# Patient Record
Sex: Female | Born: 1970 | Race: White | Hispanic: No | Marital: Married | Smoking: Never smoker
Health system: Southern US, Community
[De-identification: ages and names within clinical notes are randomized; demographics above are authoritative.]

## PROBLEM LIST (undated history)

## (undated) DIAGNOSIS — F419 Anxiety disorder, unspecified: Secondary | ICD-10-CM

## (undated) DIAGNOSIS — F329 Major depressive disorder, single episode, unspecified: Secondary | ICD-10-CM

## (undated) DIAGNOSIS — K219 Gastro-esophageal reflux disease without esophagitis: Secondary | ICD-10-CM

## (undated) DIAGNOSIS — E669 Obesity, unspecified: Secondary | ICD-10-CM

## (undated) DIAGNOSIS — M109 Gout, unspecified: Secondary | ICD-10-CM

## (undated) DIAGNOSIS — T7840XA Allergy, unspecified, initial encounter: Secondary | ICD-10-CM

## (undated) DIAGNOSIS — F32A Depression, unspecified: Secondary | ICD-10-CM

## (undated) HISTORY — DX: Anxiety disorder, unspecified: F41.9

## (undated) HISTORY — DX: Allergy, unspecified, initial encounter: T78.40XA

## (undated) HISTORY — DX: Obesity, unspecified: E66.9

## (undated) HISTORY — DX: Depression, unspecified: F32.A

## (undated) HISTORY — PX: ABDOMINAL HYSTERECTOMY: SHX81

## (undated) HISTORY — DX: Gout, unspecified: M10.9

## (undated) HISTORY — PX: TOTAL ABDOMINAL HYSTERECTOMY: SHX209

## (undated) HISTORY — DX: Major depressive disorder, single episode, unspecified: F32.9

## (undated) HISTORY — DX: Gastro-esophageal reflux disease without esophagitis: K21.9

## (undated) HISTORY — PX: CHOLECYSTECTOMY: SHX55

---

## 2007-01-31 ENCOUNTER — Ambulatory Visit: Payer: Self-pay | Admitting: Unknown Physician Specialty

## 2007-02-03 ENCOUNTER — Ambulatory Visit: Payer: Self-pay | Admitting: Gastroenterology

## 2009-05-29 ENCOUNTER — Ambulatory Visit: Payer: Self-pay | Admitting: *Deleted

## 2013-05-09 ENCOUNTER — Ambulatory Visit: Payer: Self-pay

## 2013-05-11 ENCOUNTER — Ambulatory Visit: Payer: Self-pay

## 2014-12-27 ENCOUNTER — Ambulatory Visit: Payer: Self-pay

## 2015-02-11 ENCOUNTER — Ambulatory Visit: Payer: Self-pay | Admitting: Podiatry

## 2015-05-01 ENCOUNTER — Telehealth: Payer: Self-pay | Admitting: Unknown Physician Specialty

## 2015-05-01 MED ORDER — ALPRAZOLAM 0.25 MG PO TABS: 0.2500 mg | ORAL_TABLET | Freq: Two times a day (BID) | ORAL | Status: DC | PRN

## 2015-05-01 NOTE — Telephone Encounter (Signed)
Ordered and printed.  She will have to pick up rx

## 2015-05-01 NOTE — Telephone Encounter (Signed)
Pt called stated she needs a refill on:  Alprazolam  Pt stated she is going out of town and needs a 30 day supply. Pt was last seen in November 2015. Pt is leaving to go out of town tomorrow. Please advise. Please call pt with any concerns or questions @ [phone]. Thanks.

## 2015-05-01 NOTE — Telephone Encounter (Signed)
[facility] [place]

## 2015-05-31 DIAGNOSIS — F32A Depression, unspecified: Secondary | ICD-10-CM

## 2015-05-31 DIAGNOSIS — J309 Allergic rhinitis, unspecified: Secondary | ICD-10-CM

## 2015-05-31 DIAGNOSIS — E669 Obesity, unspecified: Secondary | ICD-10-CM | POA: Insufficient documentation

## 2015-05-31 DIAGNOSIS — F329 Major depressive disorder, single episode, unspecified: Secondary | ICD-10-CM | POA: Insufficient documentation

## 2015-05-31 DIAGNOSIS — F419 Anxiety disorder, unspecified: Secondary | ICD-10-CM

## 2015-06-10 ENCOUNTER — Encounter: Payer: Self-pay | Admitting: Unknown Physician Specialty

## 2015-06-10 ENCOUNTER — Ambulatory Visit (INDEPENDENT_AMBULATORY_CARE_PROVIDER_SITE_OTHER): Payer: BC Managed Care – PPO | Admitting: Unknown Physician Specialty

## 2015-06-10 VITALS — BP 121/80 | HR 80 | Temp 98.1°F | Ht 62.6 in | Wt 163.0 lb

## 2015-06-10 DIAGNOSIS — F419 Anxiety disorder, unspecified: Secondary | ICD-10-CM

## 2015-06-10 MED ORDER — ALPRAZOLAM 0.25 MG PO TABS: 0.2500 mg | ORAL_TABLET | Freq: Two times a day (BID) | ORAL | Status: DC | PRN

## 2015-06-10 NOTE — Assessment & Plan Note (Signed)
Continue present medications. 

## 2015-06-10 NOTE — Progress Notes (Signed)
   BP 121/80 mmHg  Pulse 80  Temp(Src) 98.1 F (36.7 C)  Ht 5' 2.6" (1.59 m)  Wt 163 lb (73.936 kg)  BMI 29.25 kg/m2  SpO2 99%  LMP  (LMP Unknown)   Subjective:    Patient ID: Stacy Phelps, female    DOB: 09/03/1971, 44 y.o.   MRN: [id number]  HPI: Stacy Phelps is a 44 y.o. female  Chief Complaint  Patient presents with  . Medication Refill    pt states she needs her xanax refilled   1.  ANXIETY Takes 1 to 1 1/2 per day.  See last note in which we have tried a lot of alternative medications.  Most recently Busiprione.  She has been on Alprazolam for a long time initially prescribed by the gynecologist.  Has not increased her dose  Mood status:  controlled  H6  Satisfied with current treatment?:  no H2 Medication side effects:  no P1  Medication compliance:  excellent compliance  P1 Symptom severity:  mild  H3  .H: Depression Screen X  Anhedonia:  no  H3 Insomnia:  no  H8 Fatigue:  no  H8 Impaired concentration/indecisiveness:  no  H8 Suicidal ideations:  no  H8 Palpitations:  no  H8 Hyperventilation:  no  H8 Panic attacks:  no  H8 Agoraphobia:  no  H8 Obscessive thoughts:  no  H8 Compulsive behaviors:  no  H8  Relevant past medical, surgical, family and social history reviewed and updated as indicated. Interim medical history since our last visit reviewed. Allergies and medications reviewed and updated.  Review of Systems  Per HPI unless specifically indicated above     Objective:    BP 121/80 mmHg  Pulse 80  Temp(Src) 98.1 F (36.7 C)  Ht 5' 2.6" (1.59 m)  Wt 163 lb (73.936 kg)  BMI 29.25 kg/m2  SpO2 99%  LMP  (LMP Unknown)  Wt Readings from Last 3 Encounters:  06/10/15 163 lb (73.936 kg)  09/28/14 134 lb (60.782 kg)    Physical Exam  Constitutional: She is oriented to person, place, and time. She appears well-developed and well-nourished. No distress.  HENT:  Head: Normocephalic and atraumatic.  Eyes: Conjunctivae and lids are normal. Right eye  exhibits no discharge. Left eye exhibits no discharge. No scleral icterus.  Cardiovascular: Normal rate and regular rhythm.   Pulmonary/Chest: Effort normal. No respiratory distress.  Abdominal: Normal appearance. There is no splenomegaly or hepatomegaly.  Musculoskeletal: Normal range of motion.  Neurological: She is alert and oriented to person, place, and time.  Skin: Skin is intact. No rash noted. No pallor.  Psychiatric: She has a normal mood and affect. Her behavior is normal. Judgment and thought content normal.    No results found for this or any previous visit.    Assessment & Plan:   Problem List Items Addressed This Visit      Other   Acute anxiety - Primary    Continue present medications      Relevant Medications   ALPRAZolam (XANAX) 0.25 MG tablet       Follow up plan: Return for October for PE.

## 2015-07-05 ENCOUNTER — Other Ambulatory Visit: Payer: Self-pay | Admitting: Unknown Physician Specialty

## 2015-08-07 ENCOUNTER — Other Ambulatory Visit: Payer: Self-pay | Admitting: Unknown Physician Specialty

## 2015-09-05 ENCOUNTER — Other Ambulatory Visit: Payer: Self-pay | Admitting: Unknown Physician Specialty

## 2015-09-08 ENCOUNTER — Other Ambulatory Visit: Payer: Self-pay | Admitting: Family Medicine

## 2015-09-09 NOTE — Telephone Encounter (Signed)
Pt called needs refill on Xanax. [facility] [place] Thanks.

## 2015-09-09 NOTE — Telephone Encounter (Signed)
Patient saw Stacy CirriCheryl Wicker last; forwarding Rx request

## 2015-09-11 ENCOUNTER — Other Ambulatory Visit: Payer: Self-pay

## 2015-09-11 MED ORDER — ALPRAZOLAM 0.25 MG PO TABS: 0.2500 mg | ORAL_TABLET | Freq: Two times a day (BID) | ORAL | Status: DC | PRN

## 2015-09-11 NOTE — Telephone Encounter (Signed)
I got a message on another encounter for this patient stating that she called and wanted a refill on xanax. It looks like it was refilled on 09/05/15 so I called the patient to see if she had picked it up. Patient did not answer when I called her early but she just returned by call and she stated that she did not pick it up and states that she didn't know she had too. Rx is not up front for patient so Elnita MaxwellCheryl can you please re-write or re-print the rx for her?

## 2015-09-11 NOTE — Telephone Encounter (Signed)
Called and let patient know that rx was ready for pick up.  

## 2015-09-11 NOTE — Telephone Encounter (Signed)
Called to ask patient about this rx because it was refilled on 09/05/15 so I was calling to see if she had already picked it up. Asked for patient to please return my call.

## 2015-10-04 ENCOUNTER — Encounter: Payer: Self-pay | Admitting: Unknown Physician Specialty

## 2015-10-04 ENCOUNTER — Ambulatory Visit (INDEPENDENT_AMBULATORY_CARE_PROVIDER_SITE_OTHER): Payer: BC Managed Care – PPO | Admitting: Unknown Physician Specialty

## 2015-10-04 VITALS — BP 130/84 | HR 84 | Temp 97.6°F | Ht 63.0 in | Wt 163.0 lb

## 2015-10-04 DIAGNOSIS — M10079 Idiopathic gout, unspecified ankle and foot: Secondary | ICD-10-CM

## 2015-10-04 DIAGNOSIS — Z23 Encounter for immunization: Secondary | ICD-10-CM

## 2015-10-04 DIAGNOSIS — J309 Allergic rhinitis, unspecified: Secondary | ICD-10-CM | POA: Diagnosis not present

## 2015-10-04 DIAGNOSIS — Z0000 Encounter for general adult medical examination without abnormal findings: Secondary | ICD-10-CM | POA: Diagnosis not present

## 2015-10-04 DIAGNOSIS — F419 Anxiety disorder, unspecified: Secondary | ICD-10-CM | POA: Diagnosis not present

## 2015-10-04 DIAGNOSIS — M109 Gout, unspecified: Secondary | ICD-10-CM | POA: Insufficient documentation

## 2015-10-04 MED ORDER — MONTELUKAST SODIUM 10 MG PO TABS: 10.0000 mg | ORAL_TABLET | Freq: Every day | ORAL | Status: DC

## 2015-10-04 MED ORDER — ALPRAZOLAM 0.25 MG PO TABS: 0.2500 mg | ORAL_TABLET | Freq: Two times a day (BID) | ORAL | Status: DC | PRN

## 2015-10-04 NOTE — Progress Notes (Signed)
BP 130/84 mmHg  Pulse 84  Temp(Src) 97.6 F (36.4 C)  Ht 5\' 3"  (1.6 m)  Wt 163 lb (73.936 kg)  BMI 28.88 kg/m2  SpO2 100%  LMP  (LMP Unknown)   Subjective:    Patient ID: Stacy Phelps, female    DOB: 09/12/1971, 44 y.o.   MRN: [id number]  HPI: Stacy Phelps is a 44 y.o. female  Chief Complaint  Patient presents with  . Annual Exam   ANXIETY Takes 1 to 1 1/2 per day.  See last note in which we have tried a lot of alternative medications.  Most recently Busiprione.  She has been on Alprazolam for a long time initially prescribed by the gynecologist.  Has not increased her dose  Gout Had a gout flare this past summer in May/June.  Went to the foot doctor.  No labs were drawn.   Allergies  Does well with both Clariton and Sigulair along with the Nasonex.  She thinks the problem si at the old [employer] she works at.    She did find out she has a family history of diabetes.    Relevant past medical, surgical, family and social history reviewed and updated as indicated. Interim medical history since our last visit reviewed. Allergies and medications reviewed and updated.  Review of Systems  Constitutional: Negative.   HENT: Negative.   Eyes: Negative.   Respiratory: Negative.   Cardiovascular: Negative.   Gastrointestinal: Negative.   Endocrine: Negative.   Genitourinary: Negative.   Musculoskeletal: Negative.   Skin: Negative.   Allergic/Immunologic: Negative.   Neurological: Negative.   Hematological: Negative.   Psychiatric/Behavioral: Negative.     Per HPI unless specifically indicated above     Objective:    BP 130/84 mmHg  Pulse 84  Temp(Src) 97.6 F (36.4 C)  Ht 5\' 3"  (1.6 m)  Wt 163 lb (73.936 kg)  BMI 28.88 kg/m2  SpO2 100%  LMP  (LMP Unknown)  Wt Readings from Last 3 Encounters:  10/04/15 163 lb (73.936 kg)  06/10/15 163 lb (73.936 kg)  09/28/14 134 lb (60.782 kg)    Physical Exam  Constitutional: She is oriented to person, place, and time.  She appears well-developed and well-nourished.  HENT:  Head: Normocephalic and atraumatic.  Eyes: Pupils are equal, round, and reactive to light. Right eye exhibits no discharge. Left eye exhibits no discharge. No scleral icterus.  Neck: Normal range of motion. Neck supple. Carotid bruit is not present. No thyromegaly present.  Cardiovascular: Normal rate, regular rhythm and normal heart sounds.  Exam reveals no gallop and no friction rub.   No murmur heard. Pulmonary/Chest: Effort normal and breath sounds normal. No respiratory distress. She has no wheezes. She has no rales.  Abdominal: Soft. Bowel sounds are normal. There is no tenderness. There is no rebound.  Genitourinary: No breast swelling, tenderness or discharge.  Musculoskeletal: Normal range of motion.  Lymphadenopathy:    She has no cervical adenopathy.  Neurological: She is alert and oriented to person, place, and time.  Skin: Skin is warm, dry and intact. No rash noted.  Psychiatric: She has a normal mood and affect. Her speech is normal and behavior is normal. Judgment and thought content normal. Cognition and memory are normal.    No results found for this or any previous visit.    Assessment & Plan:   Problem List Items Addressed This Visit      Unprioritized   Allergic rhinitis   Chronic anxiety  Relevant Medications   ALPRAZolam (XANAX) 0.25 MG tablet   Gout   Relevant Orders   Uric acid    Other Visit Diagnoses    Immunization due    -  Primary    Relevant Orders    Flu Vaccine QUAD 36+ mos IM (Completed)    Routine general medical examination at a health care facility        Relevant Orders    HIV antibody    Comprehensive metabolic panel    CBC with Differential/Platelet    TSH    Lipid Panel w/o Chol/HDL Ratio        Follow up plan: Return in about 6 months (around 04/02/2016).

## 2015-10-05 LAB — CBC WITH DIFFERENTIAL/PLATELET
BASOS ABS: 0 10*3/uL (ref 0.0–0.2)
Basos: 0 %
EOS (ABSOLUTE): 0.1 10*3/uL (ref 0.0–0.4)
Eos: 1 %
HEMOGLOBIN: 13.5 g/dL (ref 11.1–15.9)
Hematocrit: 38.8 % (ref 34.0–46.6)
IMMATURE GRANS (ABS): 0 10*3/uL (ref 0.0–0.1)
Immature Granulocytes: 0 %
LYMPHS: 16 %
Lymphocytes Absolute: 1.4 10*3/uL (ref 0.7–3.1)
MCH: 30.7 pg (ref 26.6–33.0)
MCHC: 34.8 g/dL (ref 31.5–35.7)
MCV: 88 fL (ref 79–97)
MONOCYTES: 8 %
Monocytes Absolute: 0.7 10*3/uL (ref 0.1–0.9)
NEUTROS PCT: 75 %
Neutrophils Absolute: 6.7 10*3/uL (ref 1.4–7.0)
PLATELETS: 225 10*3/uL (ref 150–379)
RBC: 4.4 x10E6/uL (ref 3.77–5.28)
RDW: 13 % (ref 12.3–15.4)
WBC: 8.8 10*3/uL (ref 3.4–10.8)

## 2015-10-05 LAB — LIPID PANEL W/O CHOL/HDL RATIO
CHOLESTEROL TOTAL: 188 mg/dL (ref 100–199)
HDL: 50 mg/dL (ref >39)
LDL Calculated: 126 mg/dL — ABNORMAL HIGH (ref 0–99)
Triglycerides: 60 mg/dL (ref 0–149)
VLDL CHOLESTEROL CAL: 12 mg/dL (ref 5–40)

## 2015-10-05 LAB — COMPREHENSIVE METABOLIC PANEL
ALK PHOS: 71 IU/L (ref 39–117)
ALT: 18 IU/L (ref 0–32)
AST: 19 IU/L (ref 0–40)
Albumin/Globulin Ratio: 1.8 (ref 1.1–2.5)
Albumin: 4.8 g/dL (ref 3.5–5.5)
BILIRUBIN TOTAL: 0.6 mg/dL (ref 0.0–1.2)
BUN/Creatinine Ratio: 13 (ref 9–23)
BUN: 9 mg/dL (ref 6–24)
CHLORIDE: 98 mmol/L (ref 97–106)
CO2: 24 mmol/L (ref 18–29)
Calcium: 9.5 mg/dL (ref 8.7–10.2)
Creatinine, Ser: 0.72 mg/dL (ref 0.57–1.00)
GFR calc Af Amer: 117 mL/min/{1.73_m2} (ref >59)
GFR calc non Af Amer: 101 mL/min/{1.73_m2} (ref >59)
GLUCOSE: 85 mg/dL (ref 65–99)
Globulin, Total: 2.7 g/dL (ref 1.5–4.5)
Potassium: 3.9 mmol/L (ref 3.5–5.2)
Sodium: 138 mmol/L (ref 136–144)
TOTAL PROTEIN: 7.5 g/dL (ref 6.0–8.5)

## 2015-10-05 LAB — HIV ANTIBODY (ROUTINE TESTING W REFLEX): HIV SCREEN 4TH GENERATION: NONREACTIVE

## 2015-10-05 LAB — TSH: TSH: 1.87 u[IU]/mL (ref 0.450–4.500)

## 2015-10-05 LAB — URIC ACID: Uric Acid: 4.5 mg/dL (ref 2.5–7.1)

## 2015-10-07 ENCOUNTER — Encounter: Payer: Self-pay | Admitting: Unknown Physician Specialty

## 2015-10-07 ENCOUNTER — Telehealth: Payer: Self-pay | Admitting: Unknown Physician Specialty

## 2015-10-07 NOTE — Telephone Encounter (Signed)
Spoke to Acmeheryl about this patient's labs. Elnita MaxwellCheryl told me to let the patient know that the reason she did not do a A1C was because her glucose was within normal range. She also told me to tell the patient that her cholesterol was just a tad bit high but no need for medication. So I called the patient and let her know all of this information.

## 2015-10-07 NOTE — Telephone Encounter (Signed)
Pt would like to know results of labs. She thought she had labs drawn to check her a1c but doesn't see them when she looks at her lab results on my chart.

## 2015-10-08 ENCOUNTER — Other Ambulatory Visit: Payer: Self-pay | Admitting: Unknown Physician Specialty

## 2016-04-23 ENCOUNTER — Other Ambulatory Visit: Payer: Self-pay | Admitting: Unknown Physician Specialty

## 2016-04-29 ENCOUNTER — Ambulatory Visit (INDEPENDENT_AMBULATORY_CARE_PROVIDER_SITE_OTHER): Payer: BC Managed Care – PPO | Admitting: Unknown Physician Specialty

## 2016-04-29 ENCOUNTER — Encounter: Payer: Self-pay | Admitting: Unknown Physician Specialty

## 2016-04-29 VITALS — BP 117/81 | HR 89 | Temp 98.1°F | Ht 62.7 in | Wt 171.8 lb

## 2016-04-29 DIAGNOSIS — F419 Anxiety disorder, unspecified: Secondary | ICD-10-CM

## 2016-04-29 DIAGNOSIS — E669 Obesity, unspecified: Secondary | ICD-10-CM

## 2016-04-29 MED ORDER — ALPRAZOLAM 0.25 MG PO TABS: 0.2500 mg | ORAL_TABLET | Freq: Two times a day (BID) | ORAL | Status: DC | PRN

## 2016-04-29 NOTE — Patient Instructions (Addendum)
Please do call to schedule your mammogram; the number to schedule one at either [facility] or [facility] is [phone]  Look up "intermittent fasting." "the [place]"

## 2016-04-29 NOTE — Progress Notes (Signed)
BP 117/81 mmHg  Pulse 89  Temp(Src) 98.1 F (36.7 C)  Ht 5' 2.7" (1.593 m)  Wt 171 lb 12.8 oz (77.928 kg)  BMI 30.71 kg/m2  SpO2 98%  LMP  (LMP Unknown)   Subjective:    Patient ID: Stacy Phelps, female    DOB: 06/25/1971, 45 y.o.   MRN: [id number]  HPI: Stacy Amourammy R Hibner is a 45 y.o. female  Chief Complaint  Patient presents with  . Anxiety   Anxiety  Pt is here for refills of her alprasolam .25 mg.  She has been taking 1 1/2 pills daily.  She has not increased her dose.  Chart review show that we have tried alternative medications with significant side-effects or poor results.  She has been able to cut back using essential oils.         Frustrated about weight loss.    Relevant past medical, surgical, family and social history reviewed and updated as indicated. Interim medical history since our last visit reviewed. Allergies and medications reviewed and updated.  Review of Systems  Per HPI unless specifically indicated above     Objective:    BP 117/81 mmHg  Pulse 89  Temp(Src) 98.1 F (36.7 C)  Ht 5' 2.7" (1.593 m)  Wt 171 lb 12.8 oz (77.928 kg)  BMI 30.71 kg/m2  SpO2 98%  LMP  (LMP Unknown)  Wt Readings from Last 3 Encounters:  04/29/16 171 lb 12.8 oz (77.928 kg)  10/04/15 163 lb (73.936 kg)  06/10/15 163 lb (73.936 kg)    Physical Exam  Constitutional: She is oriented to person, place, and time. She appears well-developed and well-nourished. No distress.  HENT:  Head: Normocephalic and atraumatic.  Eyes: Conjunctivae and lids are normal. Right eye exhibits no discharge. Left eye exhibits no discharge. No scleral icterus.  Neck: Normal range of motion. Neck supple. No JVD present. Carotid bruit is not present.  Cardiovascular: Normal rate, regular rhythm and normal heart sounds.   Pulmonary/Chest: Effort normal and breath sounds normal.  Abdominal: Normal appearance. There is no splenomegaly or hepatomegaly.  Musculoskeletal: Normal range of motion.   Neurological: She is alert and oriented to person, place, and time.  Skin: Skin is warm, dry and intact. No rash noted. No pallor.  Psychiatric: She has a normal mood and affect. Her behavior is normal. Judgment and thought content normal.    Results for orders placed or performed in visit on 10/04/15  HIV antibody  Result Value Ref Range   HIV Screen 4th Generation wRfx Non Reactive Non Reactive  Comprehensive metabolic panel  Result Value Ref Range   Glucose 85 65 - 99 mg/dL   BUN 9 6 - 24 mg/dL   Creatinine, Ser 4.090.72 0.57 - 1.00 mg/dL   GFR calc non Af Amer 101 >59 mL/min/1.73   GFR calc Af Amer 117 >59 mL/min/1.73   BUN/Creatinine Ratio 13 9 - 23   Sodium 138 136 - 144 mmol/L   Potassium 3.9 3.5 - 5.2 mmol/L   Chloride 98 97 - 106 mmol/L   CO2 24 18 - 29 mmol/L   Calcium 9.5 8.7 - 10.2 mg/dL   Total Protein 7.5 6.0 - 8.5 g/dL   Albumin 4.8 3.5 - 5.5 g/dL   Globulin, Total 2.7 1.5 - 4.5 g/dL   Albumin/Globulin Ratio 1.8 1.1 - 2.5   Bilirubin Total 0.6 0.0 - 1.2 mg/dL   Alkaline Phosphatase 71 39 - 117 IU/L   AST 19 0 -  40 IU/L   ALT 18 0 - 32 IU/L  CBC with Differential/Platelet  Result Value Ref Range   WBC 8.8 3.4 - 10.8 x10E3/uL   RBC 4.40 3.77 - 5.28 x10E6/uL   Hemoglobin 13.5 11.1 - 15.9 g/dL   Hematocrit 16.1 09.6 - 46.6 %   MCV 88 79 - 97 fL   MCH 30.7 26.6 - 33.0 pg   MCHC 34.8 31.5 - 35.7 g/dL   RDW 04.5 40.9 - 81.1 %   Platelets 225 150 - 379 x10E3/uL   Neutrophils 75 %   Lymphs 16 %   Monocytes 8 %   Eos 1 %   Basos 0 %   Neutrophils Absolute 6.7 1.4 - 7.0 x10E3/uL   Lymphocytes Absolute 1.4 0.7 - 3.1 x10E3/uL   Monocytes Absolute 0.7 0.1 - 0.9 x10E3/uL   EOS (ABSOLUTE) 0.1 0.0 - 0.4 x10E3/uL   Basophils Absolute 0.0 0.0 - 0.2 x10E3/uL   Immature Granulocytes 0 %   Immature Grans (Abs) 0.0 0.0 - 0.1 x10E3/uL  TSH  Result Value Ref Range   TSH 1.870 0.450 - 4.500 uIU/mL  Lipid Panel w/o Chol/HDL Ratio  Result Value Ref Range   Cholesterol,  Total 188 100 - 199 mg/dL   Triglycerides 60 0 - 149 mg/dL   HDL 50 >91 mg/dL   VLDL Cholesterol Cal 12 5 - 40 mg/dL   LDL Calculated 478 (H) 0 - 99 mg/dL  Uric acid  Result Value Ref Range   Uric Acid 4.5 2.5 - 7.1 mg/dL      Assessment & Plan:   Problem List Items Addressed This Visit      Unprioritized   Chronic anxiety - Primary    Stable, continue present medications.        Relevant Medications   ALPRAZolam (XANAX) 0.25 MG tablet   Other Relevant Orders   MM DIGITAL SCREENING BILATERAL   Obesity    Struggling with losing weight.  Discussed diet and exercise.            Follow up plan: Return for Physical in October.

## 2016-04-29 NOTE — Assessment & Plan Note (Addendum)
Struggling with losing weight.  Discussed diet and exercise.

## 2016-04-29 NOTE — Assessment & Plan Note (Signed)
Stable, continue present medications.   

## 2016-06-11 ENCOUNTER — Ambulatory Visit
Admission: EM | Admit: 2016-06-11 | Discharge: 2016-06-11 | Disposition: A | Discharge status: Home / Self Care | Payer: BC Managed Care – PPO | Attending: Family Medicine | Admitting: Family Medicine

## 2016-06-11 ENCOUNTER — Encounter: Payer: Self-pay | Admitting: *Deleted

## 2016-06-11 DIAGNOSIS — K625 Hemorrhage of anus and rectum: Secondary | ICD-10-CM

## 2016-06-11 DIAGNOSIS — R102 Pelvic and perineal pain: Secondary | ICD-10-CM | POA: Diagnosis not present

## 2016-06-11 DIAGNOSIS — R109 Unspecified abdominal pain: Secondary | ICD-10-CM

## 2016-06-11 LAB — URINALYSIS COMPLETE WITH MICROSCOPIC (ARMC ONLY)
Bacteria, UA: NONE SEEN
Bilirubin Urine: NEGATIVE
Glucose, UA: NEGATIVE mg/dL
HGB URINE DIPSTICK: NEGATIVE
Ketones, ur: NEGATIVE mg/dL
Leukocytes, UA: NEGATIVE
NITRITE: NEGATIVE
PROTEIN: NEGATIVE mg/dL
RBC / HPF: NONE SEEN RBC/hpf (ref 0–5)
WBC UA: NONE SEEN WBC/hpf (ref 0–5)
pH: 5.5 (ref 5.0–8.0)

## 2016-06-11 LAB — OCCULT BLOOD X 1 CARD TO LAB, STOOL: Fecal Occult Bld: NEGATIVE

## 2016-06-11 NOTE — ED Notes (Signed)
Pt c/o suprapubic pain, abd cramping, urinary urgency/frequency since yesterday. Denies fever. Also pt describes small amount of rectal bleeding this am.

## 2016-06-11 NOTE — Discharge Instructions (Signed)

## 2016-06-11 NOTE — ED Provider Notes (Signed)
CSN: [id number]     Arrival date & time 06/11/16  1017 History   First MD Initiated Contact with Patient 06/11/16 1129     Chief Complaint  Patient presents with  . Abdominal Pain  . Urinary Urgency  . Urinary Frequency  . Rectal Bleeding   (Consider location/radiation/quality/duration/timing/severity/associated sxs/prior Treatment) HPI  This is a 45 year old female presents with a 2 day history of suprapubic pain with occasional cramping feeling that it's always "sore" along with nausea. She has had mild frequency urgency but no dysuria. She's had no vaginal discharge. Today she had a small amount of bleeding on the toilet paper after a hard bowel movement. He's had no vomiting and no diarrhea. She's had a hysterectomy performed in 1994 , both her ovaries have been spared. She said no problems since then and hysterectomy was performed because of endometriosis.She denies any dyspareunia.     Past Medical History  Diagnosis Date  . Anxiety   . Depression   . Allergy   . Obesity   . Gout   . Gout    Past Surgical History  Procedure Laterality Date  . Abdominal hysterectomy    . Cholecystectomy     Family History  Problem Relation Age of Onset  . Diabetes Mother   . Thyroid disease Mother   . Hypertension Father   . Heart disease Father   . Diabetes Maternal Grandmother   . Hypertension Paternal Grandmother   . Stroke Paternal Grandmother   . Hypertension Paternal Grandfather   . Cancer Paternal Grandfather     bone  . Anxiety disorder Sister    Social History  Substance Use Topics  . Smoking status: Never Smoker   . Smokeless tobacco: Never Used  . Alcohol Use: No   OB History    No data available     Review of Systems  Constitutional: Positive for activity change and appetite change. Negative for fever, chills and fatigue.  Gastrointestinal: Positive for nausea, abdominal pain, constipation, blood in stool and anal bleeding. Negative for vomiting, diarrhea and  rectal pain.  Genitourinary: Positive for urgency and frequency. Negative for dysuria and vaginal discharge.  All other systems reviewed and are negative.   Allergies  Acetaminophen  Home Medications   Prior to Admission medications   Medication Sig Start Date End Date Taking? Authorizing Provider  ALPRAZolam (XANAX) 0.25 MG tablet Take 1 tablet (0.25 mg total) by mouth 2 (two) times daily as needed. 04/29/16  Yes Gabriel Cirriheryl Wicker, NP  BIOTIN PO Take by mouth daily. Hair, Skin, and Nails   Yes Historical Provider, MD  mometasone (NASONEX) 50 MCG/ACT nasal spray Place 2 sprays into the nose daily. 10/08/15  Yes Gabriel Cirriheryl Wicker, NP  montelukast (SINGULAIR) 10 MG tablet Take 1 tablet (10 mg total) by mouth daily. 10/04/15  Yes Gabriel Cirriheryl Wicker, NP  Probiotic Product (PROBIOTIC PO) Take by mouth daily.   Yes Historical Provider, MD   Meds Ordered and Administered this Visit  Medications - No data to display  BP 128/84 mmHg  Pulse 106  Temp(Src) 98.2 F (36.8 C) (Oral)  Resp 16  Ht 5\' 2"  (1.575 m)  Wt 170 lb (77.111 kg)  BMI 31.09 kg/m2  SpO2 100%  LMP  (LMP Unknown) No data found.   Physical Exam  Constitutional: She is oriented to person, place, and time. She appears well-developed and well-nourished. No distress.  HENT:  Head: Normocephalic and atraumatic.  Eyes: Conjunctivae are normal. Pupils are equal, round, and reactive  to light.  Neck: Normal range of motion. Neck supple.  Pulmonary/Chest: Effort normal and breath sounds normal. No respiratory distress. She has no wheezes. She has no rales.  Abdominal: Soft. Bowel sounds are normal. She exhibits no distension and no mass. There is tenderness. There is no rebound and no guarding.  Patient had some mild tenderness to deep palpation in the left lower quadrant and suprapubically. There is less tenderness over the right lower quadrant.  Genitourinary: Vagina normal. Guaiac negative stool. No vaginal discharge found.  Exam was  performed in the presence of Jeannett Senior, Charity fundraiser as [employer] fast streamer. External genitalia is normal. Vaginal discharge is seen. Urinary meatus is normal. This shows no fissures or hemorrhoids apparent. Speculum exam was then carried out with the findings of a normal vaginal discharge. Normal walls. Cervix was surgically absent. Bimanual was then performed with the right adnexa being normal with no tenderness. Left adnexa was definitely tender no masses were palpable. Rectal exam was then performed with the showing a good rectal tone. No stool was in the rectal vault. Hemoccult was obtained and submitted to the laboratory for testing. There is no pain during rectal exam. There is no blood evident and no bleeding was encountered.  Musculoskeletal: Normal range of motion. She exhibits no edema or tenderness.  Neurological: She is alert and oriented to person, place, and time.  Skin: Skin is warm and dry. She is not diaphoretic.  Psychiatric: She has a normal mood and affect. Her behavior is normal. Judgment and thought content normal.  Nursing note and vitals reviewed.   ED Course  Procedures (including critical care time)  Labs Review Labs Reviewed  URINALYSIS COMPLETEWITH MICROSCOPIC ([facility] ONLY) - Abnormal; Notable for the following:    Specific Gravity, Urine <1.005 (*)    Squamous Epithelial / LPF 0-5 (*)    All other components within normal limits  OCCULT BLOOD X 1 CARD TO LAB, STOOL    Imaging Review No results found.   Visual Acuity Review  Right Eye Distance:   Left Eye Distance:   Bilateral Distance:    Right Eye Near:   Left Eye Near:    Bilateral Near:         MDM   1. Abdominal pain in female   2. Rectal bleeding   3. Adnexal tenderness, left    A long discussion with the patient regarding her symptoms and her findings today. Her rectal bleeding is most likely from a hard stool. She has no Hemoccult positive bleeding today. Her left lower quadrant tenderness could be  from constipation; because she still has ovaries remaining from her hysterectomy she is tender in the adnexa and told her that to be certain that is not an ovarian problem an ultrasound is indicated. We will do this as an outpatient- hopefully tomorrow. We will refer her back to Gabriel Cirri FNP for follow-up and continuing care. In the interim I have asked her to use stool softeners twice a day as necessary and also to consider a laxative if her bowel movements  don't return to normal. For her cramping and soreness Aleve or ibuprofen can be taken. The patient did not want any narcotic medications.Lutricia Feil, PA-C 06/11/16 1440

## 2016-06-12 ENCOUNTER — Ambulatory Visit
Admit: 2016-06-12 | Discharge: 2016-06-12 | Disposition: A | Discharge status: Home / Self Care | Payer: BC Managed Care – PPO | Attending: Emergency Medicine | Admitting: Emergency Medicine

## 2016-06-12 DIAGNOSIS — N8312 Corpus luteum cyst of left ovary: Secondary | ICD-10-CM | POA: Insufficient documentation

## 2016-06-12 DIAGNOSIS — Z90710 Acquired absence of both cervix and uterus: Secondary | ICD-10-CM | POA: Diagnosis not present

## 2016-06-12 DIAGNOSIS — R10814 Left lower quadrant abdominal tenderness: Secondary | ICD-10-CM | POA: Diagnosis present

## 2016-06-15 ENCOUNTER — Ambulatory Visit: Payer: Self-pay

## 2016-06-18 ENCOUNTER — Ambulatory Visit: Payer: Self-pay

## 2016-07-08 ENCOUNTER — Other Ambulatory Visit: Payer: Self-pay | Admitting: Unknown Physician Specialty

## 2016-07-08 ENCOUNTER — Ambulatory Visit
Admission: RE | Admit: 2016-07-08 | Discharge: 2016-07-08 | Disposition: A | Discharge status: Home / Self Care | Payer: BC Managed Care – PPO | Source: Ambulatory Visit | Attending: Unknown Physician Specialty | Admitting: Unknown Physician Specialty

## 2016-07-08 DIAGNOSIS — Z1231 Encounter for screening mammogram for malignant neoplasm of breast: Secondary | ICD-10-CM

## 2016-07-08 DIAGNOSIS — F419 Anxiety disorder, unspecified: Secondary | ICD-10-CM

## 2016-07-09 ENCOUNTER — Encounter: Payer: Self-pay | Admitting: Family Medicine

## 2016-09-27 ENCOUNTER — Other Ambulatory Visit: Payer: Self-pay | Admitting: Unknown Physician Specialty

## 2016-10-06 ENCOUNTER — Encounter: Payer: Self-pay | Admitting: Unknown Physician Specialty

## 2016-10-06 ENCOUNTER — Ambulatory Visit (INDEPENDENT_AMBULATORY_CARE_PROVIDER_SITE_OTHER): Payer: BC Managed Care – PPO | Admitting: Unknown Physician Specialty

## 2016-10-06 VITALS — BP 127/83 | HR 82 | Temp 97.6°F | Ht 62.1 in | Wt 172.0 lb

## 2016-10-06 DIAGNOSIS — Z0000 Encounter for general adult medical examination without abnormal findings: Secondary | ICD-10-CM | POA: Diagnosis not present

## 2016-10-06 DIAGNOSIS — Z23 Encounter for immunization: Secondary | ICD-10-CM | POA: Diagnosis not present

## 2016-10-06 NOTE — Progress Notes (Signed)
BP 127/83 (BP Location: Left Arm, Patient Position: Sitting, Cuff Size: Large)   Pulse 82   Temp 97.6 F (36.4 C)   Ht 5' 2.1" (1.577 m)   Wt 172 lb (78 kg)   LMP  (LMP Unknown)   SpO2 99%   BMI 31.36 kg/m    Subjective:    Patient ID: Stacy Phelps, female    DOB: 05/19/1971, 45 y.o.   MRN: [id number]  HPI: Stacy Phelps is a 45 y.o. female  Chief Complaint  Patient presents with  . Annual Exam   Pt is here for a yearly physical.  She has no specific concerns.  She did have questions about her normal mammogram and about an urgent care visit in which a luteal cyst was found.    Anxiety Takes #45/month.  Takes one in the AM and 1/2 at night.  States her anxiety is under good control  Relevant past medical, surgical, family and social history reviewed and updated as indicated. Interim medical history since our last visit reviewed. Allergies and medications reviewed and updated.  Review of Systems  Per HPI unless specifically indicated above     Objective:    BP 127/83 (BP Location: Left Arm, Patient Position: Sitting, Cuff Size: Large)   Pulse 82   Temp 97.6 F (36.4 C)   Ht 5' 2.1" (1.577 m)   Wt 172 lb (78 kg)   LMP  (LMP Unknown)   SpO2 99%   BMI 31.36 kg/m   Wt Readings from Last 3 Encounters:  10/06/16 172 lb (78 kg)  06/11/16 170 lb (77.1 kg)  04/29/16 171 lb 12.8 oz (77.9 kg)    Physical Exam  Constitutional: She is oriented to person, place, and time. She appears well-developed and well-nourished.  HENT:  Head: Normocephalic and atraumatic.  Eyes: Pupils are equal, round, and reactive to light. Right eye exhibits no discharge. Left eye exhibits no discharge. No scleral icterus.  Neck: Normal range of motion. Neck supple. Carotid bruit is not present. No thyromegaly present.  Cardiovascular: Normal rate, regular rhythm and normal heart sounds.  Exam reveals no gallop and no friction rub.   No murmur heard. Pulmonary/Chest: Effort normal and breath  sounds normal. No respiratory distress. She has no wheezes. She has no rales.  Abdominal: Soft. Bowel sounds are normal. There is no tenderness. There is no rebound.  Genitourinary: No breast swelling, tenderness or discharge.  Musculoskeletal: Normal range of motion.  Lymphadenopathy:    She has no cervical adenopathy.  Neurological: She is alert and oriented to person, place, and time.  Skin: Skin is warm, dry and intact. No rash noted.  Psychiatric: She has a normal mood and affect. Her speech is normal and behavior is normal. Judgment and thought content normal. Cognition and memory are normal.    Results for orders placed or performed during the hospital encounter of 06/11/16  Urinalysis complete, with microscopic  Result Value Ref Range   Color, Urine YELLOW YELLOW   APPearance CLEAR CLEAR   Glucose, UA NEGATIVE NEGATIVE mg/dL   Bilirubin Urine NEGATIVE NEGATIVE   Ketones, ur NEGATIVE NEGATIVE mg/dL   Specific Gravity, Urine <1.005 (L) 1.005 - 1.030   Hgb urine dipstick NEGATIVE NEGATIVE   pH 5.5 5.0 - 8.0   Protein, ur NEGATIVE NEGATIVE mg/dL   Nitrite NEGATIVE NEGATIVE   Leukocytes, UA NEGATIVE NEGATIVE   RBC / HPF NONE SEEN 0 - 5 RBC/hpf   WBC, UA NONE SEEN 0 -  5 WBC/hpf   Bacteria, UA NONE SEEN NONE SEEN   Squamous Epithelial / LPF 0-5 (A) NONE SEEN  Occult blood card to lab, stool Provider will collect  Result Value Ref Range   Fecal Occult Bld NEGATIVE NEGATIVE      Assessment & Plan:   Problem List Items Addressed This Visit    None    Visit Diagnoses    Need for influenza vaccination    -  Primary   Relevant Orders   Flu Vaccine QUAD 36+ mos IM (Completed)   Annual physical exam       Relevant Orders   CBC with Differential/Platelet   Comprehensive metabolic panel   Lipid Panel w/o Chol/HDL Ratio   TSH   VITAMIN D 25 Hydroxy (Vit-D Deficiency, Fractures)       Follow up plan: Return in about 6 months (around 04/05/2017).

## 2016-10-06 NOTE — Patient Instructions (Addendum)

## 2016-10-07 ENCOUNTER — Encounter: Payer: Self-pay | Admitting: Unknown Physician Specialty

## 2016-10-07 LAB — CBC WITH DIFFERENTIAL/PLATELET
BASOS: 0 %
Basophils Absolute: 0 10*3/uL (ref 0.0–0.2)
EOS (ABSOLUTE): 0.1 10*3/uL (ref 0.0–0.4)
EOS: 2 %
HEMATOCRIT: 36.5 % (ref 34.0–46.6)
HEMOGLOBIN: 12.6 g/dL (ref 11.1–15.9)
Immature Grans (Abs): 0 10*3/uL (ref 0.0–0.1)
Immature Granulocytes: 0 %
LYMPHS ABS: 1.4 10*3/uL (ref 0.7–3.1)
Lymphs: 28 %
MCH: 30.7 pg (ref 26.6–33.0)
MCHC: 34.5 g/dL (ref 31.5–35.7)
MCV: 89 fL (ref 79–97)
MONOS ABS: 0.5 10*3/uL (ref 0.1–0.9)
Monocytes: 9 %
NEUTROS ABS: 3.2 10*3/uL (ref 1.4–7.0)
Neutrophils: 61 %
Platelets: 239 10*3/uL (ref 150–379)
RBC: 4.11 x10E6/uL (ref 3.77–5.28)
RDW: 12.1 % — AB (ref 12.3–15.4)
WBC: 5.2 10*3/uL (ref 3.4–10.8)

## 2016-10-07 LAB — COMPREHENSIVE METABOLIC PANEL
A/G RATIO: 1.7 (ref 1.2–2.2)
ALBUMIN: 4.5 g/dL (ref 3.5–5.5)
ALK PHOS: 72 IU/L (ref 39–117)
ALT: 17 IU/L (ref 0–32)
AST: 19 IU/L (ref 0–40)
BUN / CREAT RATIO: 14 (ref 9–23)
BUN: 10 mg/dL (ref 6–24)
Bilirubin Total: 0.5 mg/dL (ref 0.0–1.2)
CO2: 25 mmol/L (ref 18–29)
Calcium: 9 mg/dL (ref 8.7–10.2)
Chloride: 96 mmol/L (ref 96–106)
Creatinine, Ser: 0.69 mg/dL (ref 0.57–1.00)
GFR calc Af Amer: 121 mL/min/{1.73_m2} (ref >59)
GFR calc non Af Amer: 105 mL/min/{1.73_m2} (ref >59)
GLOBULIN, TOTAL: 2.6 g/dL (ref 1.5–4.5)
Glucose: 77 mg/dL (ref 65–99)
POTASSIUM: 3.6 mmol/L (ref 3.5–5.2)
SODIUM: 137 mmol/L (ref 134–144)
Total Protein: 7.1 g/dL (ref 6.0–8.5)

## 2016-10-07 LAB — LIPID PANEL W/O CHOL/HDL RATIO
CHOLESTEROL TOTAL: 198 mg/dL (ref 100–199)
HDL: 45 mg/dL (ref >39)
LDL CALC: 123 mg/dL — AB (ref 0–99)
TRIGLYCERIDES: 149 mg/dL (ref 0–149)
VLDL Cholesterol Cal: 30 mg/dL (ref 5–40)

## 2016-10-07 LAB — VITAMIN D 25 HYDROXY (VIT D DEFICIENCY, FRACTURES): Vit D, 25-Hydroxy: 22.4 ng/mL — ABNORMAL LOW (ref 30.0–100.0)

## 2016-10-07 LAB — TSH: TSH: 2.08 u[IU]/mL (ref 0.450–4.500)

## 2016-10-12 ENCOUNTER — Other Ambulatory Visit: Payer: Self-pay | Admitting: Unknown Physician Specialty

## 2016-10-23 ENCOUNTER — Other Ambulatory Visit: Payer: Self-pay | Admitting: Unknown Physician Specialty

## 2016-11-18 ENCOUNTER — Other Ambulatory Visit: Payer: Self-pay | Admitting: Unknown Physician Specialty

## 2016-12-02 ENCOUNTER — Encounter: Payer: Self-pay | Admitting: Unknown Physician Specialty

## 2016-12-20 ENCOUNTER — Other Ambulatory Visit: Payer: Self-pay | Admitting: Unknown Physician Specialty

## 2016-12-23 ENCOUNTER — Telehealth: Payer: Self-pay | Admitting: Unknown Physician Specialty

## 2016-12-23 NOTE — Telephone Encounter (Signed)
Called pharmacy because RX was written 2 days ago. Pharmacy stated that they did not receive RX and I did not see it up front. So I called the prescription into the pharmacy for the patient. Will call and let her know.

## 2016-12-23 NOTE — Telephone Encounter (Signed)
Called and was expecting to leave a VM but patient answered. I let her know that I called the prescription into the pharmacy for her. I told her that Stacy Phelps gave her 2 refills on it but could not write for 90 day supply due to the medication being a controlled substance. Patient understood.

## 2016-12-23 NOTE — Telephone Encounter (Signed)
Called and left patient a VM asking for her to please return my call.  

## 2017-01-01 ENCOUNTER — Other Ambulatory Visit: Payer: Self-pay | Admitting: Unknown Physician Specialty

## 2017-02-23 ENCOUNTER — Encounter: Payer: Self-pay | Admitting: Unknown Physician Specialty

## 2017-02-23 ENCOUNTER — Ambulatory Visit (INDEPENDENT_AMBULATORY_CARE_PROVIDER_SITE_OTHER): Payer: BC Managed Care – PPO | Admitting: Unknown Physician Specialty

## 2017-02-23 VITALS — BP 131/83 | HR 85 | Temp 98.6°F | Ht 63.1 in | Wt 180.1 lb

## 2017-02-23 DIAGNOSIS — Z6830 Body mass index (BMI) 30.0-30.9, adult: Secondary | ICD-10-CM

## 2017-02-23 DIAGNOSIS — E6609 Other obesity due to excess calories: Secondary | ICD-10-CM

## 2017-02-23 DIAGNOSIS — R232 Flushing: Secondary | ICD-10-CM

## 2017-02-23 DIAGNOSIS — F419 Anxiety disorder, unspecified: Secondary | ICD-10-CM

## 2017-02-23 LAB — GLUCOSE HEMOCUE WAIVED: Glu Hemocue Waived: 92 mg/dL (ref 65–99)

## 2017-02-23 LAB — BAYER DCA HB A1C WAIVED: HB A1C: 5.1 % (ref <7.0)

## 2017-02-23 MED ORDER — ALPRAZOLAM 0.25 MG PO TABS: 0.2500 mg | ORAL_TABLET | Freq: Two times a day (BID) | ORAL | 5 refills | Status: DC | PRN

## 2017-02-23 NOTE — Assessment & Plan Note (Signed)
Stable, continue present medications.   

## 2017-02-23 NOTE — Assessment & Plan Note (Signed)
Discussed weight loss. 

## 2017-02-23 NOTE — Progress Notes (Signed)
BP 131/83 (BP Location: Left Arm, Patient Position: Sitting, Cuff Size: Large)   Pulse 85   Temp 98.6 F (37 C)   Ht 5' 3.1" (1.603 m)   Wt 180 lb 1.6 oz (81.7 kg)   LMP  (LMP Unknown)   SpO2 98%   BMI 31.80 kg/m    Subjective:    Patient ID: Stacy Phelps, female    DOB: [birth year], 46 y.o.   MRN: [id number]  HPI: Stacy Phelps is a 46 y.o. female  Chief Complaint  Patient presents with  . Anxiety  . Labs Only    pt states that Stacy Phelps was recently diagnosed with diabetes and has it in Stacy family, so she would like to be checked    Anxiety Takes Xanax .  1 in the AM and 1/2 in the PM. Anxiety is under good control.   Tried Paxil and Wellbutrin with side effects.    Depression screen [facility] 2/9 02/23/2017 10/06/2016 10/04/2015  Decreased Interest 0 0 0  Down, Depressed, Hopeless 0 0 0  PHQ - 2 Score 0 0 0  Altered sleeping - 0 -  Tired, decreased energy - 0 -  Change in appetite - 0 -  Feeling bad or failure about yourself  - 0 -  Trouble concentrating - 0 -  Moving slowly or fidgety/restless - 0 -  Suicidal thoughts - 0 -  PHQ-9 Score - 0 -    Diabetes Phelps recently diagnosed with diabetes.  Since she eats the same food, she would like to be checked and has a significant family history and is overweight  Sweating States she having a lot of problems with night sweats and being warm all the time.  She had a hysterectomy.  Last TSH was normal  Past Surgical History:  Procedure Laterality Date  . ABDOMINAL HYSTERECTOMY    . CHOLECYSTECTOMY      Relevant past medical, surgical, family and social history reviewed and updated as indicated. Interim medical history since our last visit reviewed. Allergies and medications reviewed and updated.  Review of Systems  Per HPI unless specifically indicated above     Objective:    BP 131/83 (BP Location: Left Arm, Patient Position: Sitting, Cuff Size: Large)   Pulse 85   Temp 98.6 F (37 C)   Ht 5' 3.1" (1.603  m)   Wt 180 lb 1.6 oz (81.7 kg)   LMP  (LMP Unknown)   SpO2 98%   BMI 31.80 kg/m   Wt Readings from Last 3 Encounters:  02/23/17 180 lb 1.6 oz (81.7 kg)  10/06/16 172 lb (78 kg)  06/11/16 170 lb (77.1 kg)    Physical Exam  Constitutional: She is oriented to person, place, and time. She appears well-developed and well-nourished. No distress.  HENT:  Head: Normocephalic and atraumatic.  Eyes: Conjunctivae and lids are normal. Right eye exhibits no discharge. Left eye exhibits no discharge. No scleral icterus.  Neck: Normal range of motion. Neck supple. No JVD present. Carotid bruit is not present.  Cardiovascular: Normal rate, regular rhythm and normal heart sounds.   Pulmonary/Chest: Effort normal and breath sounds normal.  Abdominal: Normal appearance. There is no splenomegaly or hepatomegaly.  Musculoskeletal: Normal range of motion.  Neurological: She is alert and oriented to person, place, and time.  Skin: Skin is warm, dry and intact. No rash noted. No pallor.  Psychiatric: She has a normal mood and affect. Stacy behavior is normal. Judgment and thought content normal.  Results for orders placed or performed in visit on 10/06/16  CBC with Differential/Platelet  Result Value Ref Range   WBC 5.2 3.4 - 10.8 x10E3/uL   RBC 4.11 3.77 - 5.28 x10E6/uL   Hemoglobin 12.6 11.1 - 15.9 g/dL   Hematocrit 16.1 09.6 - 46.6 %   MCV 89 79 - 97 fL   MCH 30.7 26.6 - 33.0 pg   MCHC 34.5 31.5 - 35.7 g/dL   RDW 04.5 (L) 40.9 - 81.1 %   Platelets 239 150 - 379 x10E3/uL   Neutrophils 61 Not Estab. %   Lymphs 28 Not Estab. %   Monocytes 9 Not Estab. %   Eos 2 Not Estab. %   Basos 0 Not Estab. %   Neutrophils Absolute 3.2 1.4 - 7.0 x10E3/uL   Lymphocytes Absolute 1.4 0.7 - 3.1 x10E3/uL   Monocytes Absolute 0.5 0.1 - 0.9 x10E3/uL   EOS (ABSOLUTE) 0.1 0.0 - 0.4 x10E3/uL   Basophils Absolute 0.0 0.0 - 0.2 x10E3/uL   Immature Granulocytes 0 Not Estab. %   Immature Grans (Abs) 0.0 0.0 - 0.1  x10E3/uL  Comprehensive metabolic panel  Result Value Ref Range   Glucose 77 65 - 99 mg/dL   BUN 10 6 - 24 mg/dL   Creatinine, Ser 9.14 0.57 - 1.00 mg/dL   GFR calc non Af Amer 105 >59 mL/min/1.73   GFR calc Af Amer 121 >59 mL/min/1.73   BUN/Creatinine Ratio 14 9 - 23   Sodium 137 134 - 144 mmol/L   Potassium 3.6 3.5 - 5.2 mmol/L   Chloride 96 96 - 106 mmol/L   CO2 25 18 - 29 mmol/L   Calcium 9.0 8.7 - 10.2 mg/dL   Total Protein 7.1 6.0 - 8.5 g/dL   Albumin 4.5 3.5 - 5.5 g/dL   Globulin, Total 2.6 1.5 - 4.5 g/dL   Albumin/Globulin Ratio 1.7 1.2 - 2.2   Bilirubin Total 0.5 0.0 - 1.2 mg/dL   Alkaline Phosphatase 72 39 - 117 IU/L   AST 19 0 - 40 IU/L   ALT 17 0 - 32 IU/L  Lipid Panel w/o Chol/HDL Ratio  Result Value Ref Range   Cholesterol, Total 198 100 - 199 mg/dL   Triglycerides 782 0 - 149 mg/dL   HDL 45 >95 mg/dL   VLDL Cholesterol Cal 30 5 - 40 mg/dL   LDL Calculated 621 (H) 0 - 99 mg/dL  TSH  Result Value Ref Range   TSH 2.080 0.450 - 4.500 uIU/mL  VITAMIN D 25 Hydroxy (Vit-D Deficiency, Fractures)  Result Value Ref Range   Vit D, 25-Hydroxy 22.4 (L) 30.0 - 100.0 ng/mL      Assessment & Plan:   Problem List Items Addressed This Visit      Unprioritized   Chronic anxiety    Stable, continue present medications.        Relevant Medications   ALPRAZolam (XANAX) 0.25 MG tablet   Obesity - Primary    Discussed weight loss      Relevant Orders   Glucose Hemocue Waived   Bayer DCA Hb A1c Waived    Other Visit Diagnoses    Hot flashes       Relevant Orders   FSH   LH       Follow up plan: Return in about 6 months (around 08/25/2017) for physical.

## 2017-02-24 ENCOUNTER — Encounter: Payer: Self-pay | Admitting: Unknown Physician Specialty

## 2017-02-24 LAB — FOLLICLE STIMULATING HORMONE: FSH: 4.9 m[IU]/mL

## 2017-02-24 LAB — LUTEINIZING HORMONE: LH: 6 m[IU]/mL

## 2017-02-24 NOTE — Progress Notes (Signed)
Notified pt by mychart

## 2017-03-31 ENCOUNTER — Other Ambulatory Visit: Payer: Self-pay | Admitting: Unknown Physician Specialty

## 2017-04-06 ENCOUNTER — Ambulatory Visit: Payer: BC Managed Care – PPO | Admitting: Unknown Physician Specialty

## 2017-04-20 ENCOUNTER — Ambulatory Visit: Payer: Self-pay | Admitting: Unknown Physician Specialty

## 2017-06-22 ENCOUNTER — Encounter: Payer: Self-pay | Admitting: Unknown Physician Specialty

## 2017-06-27 ENCOUNTER — Other Ambulatory Visit: Payer: Self-pay | Admitting: Unknown Physician Specialty

## 2017-08-09 ENCOUNTER — Other Ambulatory Visit: Payer: Self-pay | Admitting: Unknown Physician Specialty

## 2017-08-09 DIAGNOSIS — Z1231 Encounter for screening mammogram for malignant neoplasm of breast: Secondary | ICD-10-CM

## 2017-08-24 ENCOUNTER — Ambulatory Visit
Admission: RE | Admit: 2017-08-24 | Discharge: 2017-08-24 | Disposition: A | Discharge status: Home / Self Care | Payer: BC Managed Care – PPO | Source: Ambulatory Visit | Attending: Unknown Physician Specialty | Admitting: Unknown Physician Specialty

## 2017-08-24 DIAGNOSIS — Z1231 Encounter for screening mammogram for malignant neoplasm of breast: Secondary | ICD-10-CM | POA: Diagnosis not present

## 2017-09-30 ENCOUNTER — Other Ambulatory Visit: Payer: Self-pay | Admitting: Unknown Physician Specialty

## 2017-10-13 ENCOUNTER — Encounter: Payer: BC Managed Care – PPO | Admitting: Unknown Physician Specialty

## 2017-10-19 ENCOUNTER — Encounter: Payer: BC Managed Care – PPO | Admitting: Unknown Physician Specialty

## 2017-11-07 ENCOUNTER — Other Ambulatory Visit: Payer: Self-pay | Admitting: Unknown Physician Specialty

## 2017-11-19 ENCOUNTER — Other Ambulatory Visit: Payer: BC Managed Care – PPO

## 2017-11-19 ENCOUNTER — Other Ambulatory Visit: Payer: Self-pay | Admitting: Unknown Physician Specialty

## 2017-11-19 ENCOUNTER — Ambulatory Visit (INDEPENDENT_AMBULATORY_CARE_PROVIDER_SITE_OTHER): Payer: BC Managed Care – PPO | Admitting: Unknown Physician Specialty

## 2017-11-19 ENCOUNTER — Encounter: Payer: Self-pay | Admitting: Unknown Physician Specialty

## 2017-11-19 VITALS — BP 124/85 | HR 84 | Temp 98.2°F | Ht 61.3 in | Wt 174.0 lb

## 2017-11-19 DIAGNOSIS — Z0000 Encounter for general adult medical examination without abnormal findings: Secondary | ICD-10-CM

## 2017-11-19 DIAGNOSIS — Z23 Encounter for immunization: Secondary | ICD-10-CM

## 2017-11-19 NOTE — Progress Notes (Signed)
BP 124/85   Pulse 84   Temp 98.2 F (36.8 C) (Oral)   Ht 5' 1.3" (1.557 m)   Wt 174 lb (78.9 kg)   LMP  (LMP Unknown)   SpO2 100%   BMI 32.56 kg/m    Subjective:    Patient ID: Stacy Phelps, female    DOB: 07/16/1971, 46 y.o.   MRN: [id number]  HPI: Stacy Amourammy R Heick is a 46 y.o. female  Chief Complaint  Patient presents with  . Annual Exam   Relevant past medical, surgical, family and social history reviewed and updated as indicated. Interim medical history since our last visit reviewed. Allergies and medications reviewed and updated.  Review of Systems  Constitutional: Negative.   HENT: Negative.   Eyes: Negative.   Respiratory: Negative.   Cardiovascular: Negative.   Gastrointestinal: Negative.   Endocrine: Negative.   Genitourinary: Negative.   Musculoskeletal: Negative.   Skin: Negative.   Allergic/Immunologic: Negative.   Neurological: Negative.   Hematological: Negative.   Psychiatric/Behavioral:       No Xanax since May.  Started going to yoga.  Feels a lot better    Per HPI unless specifically indicated above     Objective:    BP 124/85   Pulse 84   Temp 98.2 F (36.8 C) (Oral)   Ht 5' 1.3" (1.557 m)   Wt 174 lb (78.9 kg)   LMP  (LMP Unknown)   SpO2 100%   BMI 32.56 kg/m   Wt Readings from Last 3 Encounters:  11/19/17 174 lb (78.9 kg)  02/23/17 180 lb 1.6 oz (81.7 kg)  10/06/16 172 lb (78 kg)    Physical Exam  Constitutional: She is oriented to person, place, and time. She appears well-developed and well-nourished.  HENT:  Head: Normocephalic and atraumatic.  Eyes: Pupils are equal, round, and reactive to light. Right eye exhibits no discharge. Left eye exhibits no discharge. No scleral icterus.  Neck: Normal range of motion. Neck supple. Carotid bruit is not present. No thyromegaly present.  Cardiovascular: Normal rate, regular rhythm and normal heart sounds. Exam reveals no gallop and no friction rub.  No murmur heard. Pulmonary/Chest:  Effort normal and breath sounds normal. No respiratory distress. She has no wheezes. She has no rales.  Abdominal: Soft. Bowel sounds are normal. There is no tenderness. There is no rebound.  Genitourinary: No breast swelling, tenderness or discharge.  Musculoskeletal: Normal range of motion.  Lymphadenopathy:    She has no cervical adenopathy.  Neurological: She is alert and oriented to person, place, and time.  Skin: Skin is warm, dry and intact. No rash noted.  Psychiatric: She has a normal mood and affect. Her speech is normal and behavior is normal. Judgment and thought content normal. Cognition and memory are normal.    Results for orders placed or performed in visit on 02/23/17  Glucose Hemocue Waived  Result Value Ref Range   Glu Hemocue Waived 92 65 - 99 mg/dL  Bayer DCA Hb W0JA1c Waived  Result Value Ref Range   Bayer DCA Hb A1c Waived 5.1 <7.0 %  FSH  Result Value Ref Range   FSH 4.9 mIU/mL  LH  Result Value Ref Range   LH 6.0 mIU/mL      Assessment & Plan:   Problem List Items Addressed This Visit    None    Visit Diagnoses    Need for influenza vaccination    -  Primary   Relevant Orders  Flu Vaccine QUAD 36+ mos IM (Completed)   Annual physical exam           Follow up plan: Return in about 1 year (around 11/19/2018), or if symptoms worsen or fail to improve.

## 2017-11-19 NOTE — Patient Instructions (Addendum)

## 2017-11-23 LAB — LIPID PANEL W/O CHOL/HDL RATIO
Cholesterol, Total: 200 mg/dL — ABNORMAL HIGH (ref 100–199)
HDL: 49 mg/dL (ref >39)
LDL CALC: 125 mg/dL — AB (ref 0–99)
Triglycerides: 129 mg/dL (ref 0–149)
VLDL CHOLESTEROL CAL: 26 mg/dL (ref 5–40)

## 2017-11-23 LAB — SPECIMEN STATUS REPORT

## 2017-11-24 LAB — COMPREHENSIVE METABOLIC PANEL
A/G RATIO: 1.9 (ref 1.2–2.2)
ALT: 21 IU/L (ref 0–32)
AST: 17 IU/L (ref 0–40)
Albumin: 4.8 g/dL (ref 3.5–5.5)
Alkaline Phosphatase: 74 IU/L (ref 39–117)
BUN/Creatinine Ratio: 33 — ABNORMAL HIGH (ref 9–23)
BUN: 19 mg/dL (ref 6–24)
Bilirubin Total: 0.4 mg/dL (ref 0.0–1.2)
CALCIUM: 9.2 mg/dL (ref 8.7–10.2)
CO2: 20 mmol/L (ref 20–29)
CREATININE: 0.57 mg/dL (ref 0.57–1.00)
Chloride: 103 mmol/L (ref 96–106)
GFR calc Af Amer: 128 mL/min/{1.73_m2} (ref >59)
GFR, EST NON AFRICAN AMERICAN: 111 mL/min/{1.73_m2} (ref >59)
GLUCOSE: 88 mg/dL (ref 65–99)
Globulin, Total: 2.5 g/dL (ref 1.5–4.5)
POTASSIUM: 4 mmol/L (ref 3.5–5.2)
Sodium: 143 mmol/L (ref 134–144)
Total Protein: 7.3 g/dL (ref 6.0–8.5)

## 2017-11-24 LAB — LIPID PANEL W/O CHOL/HDL RATIO
Cholesterol, Total: 200 mg/dL — ABNORMAL HIGH (ref 100–199)
HDL: 49 mg/dL (ref >39)
LDL Calculated: 125 mg/dL — ABNORMAL HIGH (ref 0–99)
Triglycerides: 129 mg/dL (ref 0–149)
VLDL Cholesterol Cal: 26 mg/dL (ref 5–40)

## 2017-11-24 LAB — CBC WITH DIFFERENTIAL/PLATELET
BASOS ABS: 0 10*3/uL (ref 0.0–0.2)
Basos: 1 %
EOS (ABSOLUTE): 0.1 10*3/uL (ref 0.0–0.4)
Eos: 2 %
Hematocrit: 39 % (ref 34.0–46.6)
Hemoglobin: 13.4 g/dL (ref 11.1–15.9)
IMMATURE GRANULOCYTES: 0 %
Immature Grans (Abs): 0 10*3/uL (ref 0.0–0.1)
LYMPHS: 23 %
Lymphocytes Absolute: 1.3 10*3/uL (ref 0.7–3.1)
MCH: 30.7 pg (ref 26.6–33.0)
MCHC: 34.4 g/dL (ref 31.5–35.7)
MCV: 89 fL (ref 79–97)
MONOS ABS: 0.5 10*3/uL (ref 0.1–0.9)
Monocytes: 9 %
NEUTROS PCT: 65 %
Neutrophils Absolute: 3.6 10*3/uL (ref 1.4–7.0)
PLATELETS: 239 10*3/uL (ref 150–379)
RBC: 4.37 x10E6/uL (ref 3.77–5.28)
RDW: 12.5 % (ref 12.3–15.4)
WBC: 5.4 10*3/uL (ref 3.4–10.8)

## 2017-11-24 LAB — SPECIMEN STATUS REPORT

## 2017-11-24 LAB — TSH: TSH: 3.92 u[IU]/mL (ref 0.450–4.500)

## 2017-11-26 ENCOUNTER — Encounter: Payer: Self-pay | Admitting: Unknown Physician Specialty

## 2017-11-26 NOTE — Progress Notes (Signed)
Normal labs.  Pt notified through mychart

## 2017-11-26 NOTE — Progress Notes (Signed)
Notified pt by mychart

## 2018-01-03 ENCOUNTER — Other Ambulatory Visit: Payer: Self-pay | Admitting: Unknown Physician Specialty

## 2018-03-30 ENCOUNTER — Other Ambulatory Visit: Payer: Self-pay | Admitting: Unknown Physician Specialty

## 2018-04-10 ENCOUNTER — Other Ambulatory Visit: Payer: Self-pay | Admitting: Unknown Physician Specialty

## 2018-04-25 ENCOUNTER — Encounter: Payer: Self-pay | Admitting: Unknown Physician Specialty

## 2018-05-02 ENCOUNTER — Encounter: Payer: Self-pay | Admitting: Family Medicine

## 2018-05-02 ENCOUNTER — Ambulatory Visit (INDEPENDENT_AMBULATORY_CARE_PROVIDER_SITE_OTHER): Payer: BC Managed Care – PPO | Admitting: Family Medicine

## 2018-05-02 VITALS — BP 124/88 | HR 92 | Wt 183.0 lb

## 2018-05-02 DIAGNOSIS — N390 Urinary tract infection, site not specified: Secondary | ICD-10-CM | POA: Diagnosis not present

## 2018-05-02 LAB — UA/M W/RFLX CULTURE, ROUTINE
Bilirubin, UA: NEGATIVE
GLUCOSE, UA: NEGATIVE
Ketones, UA: NEGATIVE
Leukocytes, UA: NEGATIVE
Nitrite, UA: NEGATIVE
PROTEIN UA: NEGATIVE
RBC, UA: NEGATIVE
Specific Gravity, UA: 1.01 (ref 1.005–1.030)
Urobilinogen, Ur: 0.2 mg/dL (ref 0.2–1.0)
pH, UA: 5.5 (ref 5.0–7.5)

## 2018-05-02 NOTE — Progress Notes (Signed)
BP 124/88   Pulse 92   Wt 183 lb (83 kg)   LMP  (LMP Unknown)   SpO2 98%   BMI 34.24 kg/m    Subjective:    Patient ID: Stacy Phelps, female    DOB: 03/27/1971, 47 y.o.   MRN: [id number]  HPI: Stacy Phelps is a 47 y.o. female  Chief Complaint  Patient presents with  . Urinary Tract Infection  Patient with some burning while urinating.  No blood in stool or urine has had some twinge type and ovary area patient's had hysterectomy but left ovaries. Has been drinking a lot of cranberry juice. 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 Interim medical history since our last visit reviewed. Allergies and medications reviewed and updated.  Review of Systems  Constitutional: Negative.   Respiratory: Negative.   Cardiovascular: Negative.     Per HPI unless specifically indicated above     Objective:    BP 124/88   Pulse 92   Wt 183 lb (83 kg)   LMP  (LMP Unknown)   SpO2 98%   BMI 34.24 kg/m   Wt Readings from Last 3 Encounters:  05/02/18 183 lb (83 kg)  11/19/17 174 lb (78.9 kg)  02/23/17 180 lb 1.6 oz (81.7 kg)    Physical Exam  Constitutional: She is oriented to person, place, and time. She appears well-developed and well-nourished.  HENT:  Head: Normocephalic and atraumatic.  Eyes: Conjunctivae and EOM are normal.  Neck: Normal range of motion.  Cardiovascular: Normal rate, regular rhythm and normal heart sounds.  Pulmonary/Chest: Effort normal and breath sounds normal.  Musculoskeletal: Normal range of motion.  Neurological: She is alert and oriented to person, place, and time.  Skin: No erythema.  Psychiatric: She has a normal mood and affect. Her behavior is normal. Judgment and thought content normal.    Results for orders placed or performed in visit on 11/19/17  CBC with Differential/Platelet  Result Value Ref Range   WBC 5.4 3.4 - 10.8  x10E3/uL   RBC 4.37 3.77 - 5.28 x10E6/uL   Hemoglobin 13.4 11.1 - 15.9 g/dL   Hematocrit 40.939.0 81.134.0 - 46.6 %   MCV 89 79 - 97 fL   MCH 30.7 26.6 - 33.0 pg   MCHC 34.4 31.5 - 35.7 g/dL   RDW 91.412.5 78.212.3 - 95.615.4 %   Platelets 239 150 - 379 x10E3/uL   Neutrophils 65 Not Estab. %   Lymphs 23 Not Estab. %   Monocytes 9 Not Estab. %   Eos 2 Not Estab. %   Basos 1 Not Estab. %   Neutrophils Absolute 3.6 1.4 - 7.0 x10E3/uL   Lymphocytes Absolute 1.3 0.7 - 3.1 x10E3/uL   Monocytes Absolute 0.5 0.1 - 0.9 x10E3/uL   EOS (ABSOLUTE) 0.1 0.0 - 0.4 x10E3/uL   Basophils Absolute 0.0 0.0 - 0.2 x10E3/uL   Immature Granulocytes 0 Not Estab. %   Immature Grans (Abs) 0.0 0.0 - 0.1 x10E3/uL  Comprehensive metabolic panel  Result Value Ref Range   Glucose 88 65 - 99 mg/dL   BUN 19 6 - 24 mg/dL   Creatinine, Ser 2.130.57 0.57 - 1.00 mg/dL   GFR calc non Af Amer 111 >59 mL/min/1.73   GFR calc Af Amer 128 >59 mL/min/1.73   BUN/Creatinine Ratio 33 (H) 9 - 23   Sodium 143 134 - 144 mmol/L  Potassium 4.0 3.5 - 5.2 mmol/L   Chloride 103 96 - 106 mmol/L   CO2 20 20 - 29 mmol/L   Calcium 9.2 8.7 - 10.2 mg/dL   Total Protein 7.3 6.0 - 8.5 g/dL   Albumin 4.8 3.5 - 5.5 g/dL   Globulin, Total 2.5 1.5 - 4.5 g/dL   Albumin/Globulin Ratio 1.9 1.2 - 2.2   Bilirubin Total 0.4 0.0 - 1.2 mg/dL   Alkaline Phosphatase 74 39 - 117 IU/L   AST 17 0 - 40 IU/L   ALT 21 0 - 32 IU/L  TSH  Result Value Ref Range   TSH 3.920 0.450 - 4.500 uIU/mL      Assessment & Plan:   Problem List Items Addressed This Visit    None    Visit Diagnoses    Urinary tract infection without hematuria, site unspecified    -  Primary   Relevant Orders   UA/M w/rflx Culture, Routine    Discussed no UTI urinalysis clear discuss urine is ascitic will drink water or milk. Observe for blood as a sign of kidney stones. Twinge on abdominal sides most likely ovulation symptoms.  Follow up plan: Return if symptoms worsen or fail to improve, for  As scheduled.

## 2018-07-14 ENCOUNTER — Other Ambulatory Visit: Payer: Self-pay | Admitting: Unknown Physician Specialty

## 2018-09-13 ENCOUNTER — Encounter: Payer: Self-pay | Admitting: Family Medicine

## 2018-09-14 ENCOUNTER — Other Ambulatory Visit: Payer: Self-pay | Admitting: Family Medicine

## 2018-09-14 DIAGNOSIS — Z1239 Encounter for other screening for malignant neoplasm of breast: Secondary | ICD-10-CM

## 2018-09-22 ENCOUNTER — Other Ambulatory Visit: Payer: Self-pay | Admitting: Family Medicine

## 2018-09-22 DIAGNOSIS — Z1231 Encounter for screening mammogram for malignant neoplasm of breast: Secondary | ICD-10-CM

## 2018-10-18 ENCOUNTER — Ambulatory Visit
Admission: RE | Admit: 2018-10-18 | Discharge: 2018-10-18 | Disposition: A | Discharge status: Home / Self Care | Payer: BC Managed Care – PPO | Source: Ambulatory Visit | Attending: Family Medicine | Admitting: Family Medicine

## 2018-10-18 DIAGNOSIS — Z1231 Encounter for screening mammogram for malignant neoplasm of breast: Secondary | ICD-10-CM | POA: Diagnosis not present

## 2018-11-18 ENCOUNTER — Encounter: Payer: Self-pay | Admitting: Unknown Physician Specialty

## 2018-11-21 ENCOUNTER — Encounter: Payer: BC Managed Care – PPO | Admitting: Unknown Physician Specialty

## 2018-11-22 ENCOUNTER — Encounter: Payer: Self-pay | Admitting: Family Medicine

## 2018-11-22 ENCOUNTER — Ambulatory Visit (INDEPENDENT_AMBULATORY_CARE_PROVIDER_SITE_OTHER): Payer: BC Managed Care – PPO | Admitting: Family Medicine

## 2018-11-22 ENCOUNTER — Ambulatory Visit: Payer: Self-pay

## 2018-11-22 VITALS — BP 126/84 | HR 99 | Temp 98.0°F | Ht 62.75 in | Wt 181.0 lb

## 2018-11-22 DIAGNOSIS — Z23 Encounter for immunization: Secondary | ICD-10-CM

## 2018-11-22 DIAGNOSIS — R1013 Epigastric pain: Secondary | ICD-10-CM | POA: Diagnosis not present

## 2018-11-22 DIAGNOSIS — G44219 Episodic tension-type headache, not intractable: Secondary | ICD-10-CM | POA: Diagnosis not present

## 2018-11-22 DIAGNOSIS — J3089 Other allergic rhinitis: Secondary | ICD-10-CM

## 2018-11-22 DIAGNOSIS — Z0000 Encounter for general adult medical examination without abnormal findings: Secondary | ICD-10-CM | POA: Diagnosis not present

## 2018-11-22 LAB — UA/M W/RFLX CULTURE, ROUTINE
Bilirubin, UA: NEGATIVE
GLUCOSE, UA: NEGATIVE
Ketones, UA: NEGATIVE
Leukocytes, UA: NEGATIVE
Nitrite, UA: NEGATIVE
Protein, UA: NEGATIVE
RBC, UA: NEGATIVE
Specific Gravity, UA: 1.015 (ref 1.005–1.030)
UUROB: 1 mg/dL (ref 0.2–1.0)
pH, UA: 7 (ref 5.0–7.5)

## 2018-11-22 MED ORDER — CYCLOBENZAPRINE HCL 10 MG PO TABS: 10.0000 mg | ORAL_TABLET | Freq: Every evening | ORAL | 0 refills | Status: DC | PRN

## 2018-11-22 MED ORDER — PANTOPRAZOLE SODIUM 40 MG PO TBEC: 40.0000 mg | DELAYED_RELEASE_TABLET | Freq: Every day | ORAL | 3 refills | Status: DC

## 2018-11-22 MED ORDER — SUCRALFATE 1 G PO TABS: 1.0000 g | ORAL_TABLET | Freq: Three times a day (TID) | ORAL | 0 refills | Status: DC

## 2018-11-22 NOTE — Telephone Encounter (Signed)
  Reason for Disposition . General information question, no triage required and triager able to answer question  Answer Assessment - Initial Assessment Questions 1. REASON FOR CALL or QUESTION: "What is your reason for calling today?" or "How can I best help you?" or "What question do you have that I can help answer?"     Request for medications to be transferred to another [facility] location.  Protocols used: INFORMATION ONLY CALL-A-AH

## 2018-11-22 NOTE — Patient Instructions (Signed)
Influenza (Flu) Vaccine (Inactivated or Recombinant): What You Need to Know  1. Why get vaccinated?  Influenza vaccine can prevent influenza (flu).  Flu is a contagious disease that spreads around the [place] every year, usually between October and May. Anyone can get the flu, but it is more dangerous for some people. Infants and young children, people 48 years of age and older, pregnant women, and people with certain health conditions or a weakened immune system are at greatest risk of flu complications.  Pneumonia, bronchitis, sinus infections and ear infections are examples of flu-related complications. If you have a medical condition, such as heart disease, cancer or diabetes, flu can make it worse.  Flu can cause fever and chills, sore throat, muscle aches, fatigue, cough, headache, and runny or stuffy nose. Some people may have vomiting and diarrhea, though this is more common in children than adults.  Each year thousands of people in the [place] die from flu, and many more are hospitalized. Flu vaccine prevents millions of illnesses and flu-related visits to the doctor each year.  2. Influenza vaccine  CDC recommends everyone 6 months of age and older get vaccinated every flu season. Children 6 months through 8 years of age may need 2 doses during a single flu season. Everyone else needs only 1 dose each flu season.  It takes about 2 weeks for protection to develop after vaccination.  There are many flu viruses, and they are always changing. Each year a new flu vaccine is made to protect against three or four viruses that are likely to cause disease in the upcoming flu season. Even when the vaccine doesn't exactly match these viruses, it may still provide some protection.  Influenza vaccine does not cause flu.  Influenza vaccine may be given at the same time as other vaccines.  3. Talk with your health care provider  Tell your vaccine provider if the person getting the vaccine:  · Has had an  allergic reaction after a previous dose of influenza vaccine, or has any severe, life-threatening allergies.  · Has ever had Guillain-Barré Syndrome (also called GBS).  In some cases, your health care provider may decide to postpone influenza vaccination to a future visit.  People with minor illnesses, such as a cold, may be vaccinated. People who are moderately or severely ill should usually wait until they recover before getting influenza vaccine.  Your health care provider can give you more information.  4. Risks of a vaccine reaction  · Soreness, redness, and swelling where shot is given, fever, muscle aches, and headache can happen after influenza vaccine.  · There may be a very small increased risk of Guillain-Barré Syndrome (GBS) after inactivated influenza vaccine (the flu shot).  Young children who get the flu shot along with pneumococcal vaccine (PCV13), and/or DTaP vaccine at the same time might be slightly more likely to have a seizure caused by fever. Tell your health care provider if a child who is getting flu vaccine has ever had a seizure.  People sometimes faint after medical procedures, including vaccination. Tell your provider if you feel dizzy or have vision changes or ringing in the ears.  As with any medicine, there is a very remote chance of a vaccine causing a severe allergic reaction, other serious injury, or death.  5. What if there is a serious problem?  An allergic reaction could occur after the vaccinated person leaves the clinic. If you see signs of a severe allergic reaction (hives, swelling   of the face and throat, difficulty breathing, a fast heartbeat, dizziness, or weakness), call 9-1-1 and get the person to the nearest hospital.  For other signs that concern you, call your health care provider.  Adverse reactions should be reported to the Vaccine Adverse Event Reporting System (VAERS). Your health care provider will usually file this report, or you can do it yourself. Visit the  VAERS website at www.vaers.hhs.gov or call 1-800-822-7967.VAERS is only for reporting reactions, and VAERS staff do not give medical advice.  6. The National Vaccine Injury Compensation Program  The National Vaccine Injury Compensation Program (VICP) is a federal program that was created to compensate people who may have been injured by certain vaccines. Visit the VICP website at www.hrsa.gov/vaccinecompensation or call 1-800-338-2382 to learn about the program and about filing a claim. There is a time limit to file a claim for compensation.  7. How can I learn more?  · Ask your healthcare provider.  · Call your local or state health department.  · Contact the Centers for Disease Control and Prevention (CDC):  ? Call 1-800-232-4636 (1-800-CDC-INFO) or  ? Visit CDC's www.cdc.gov/flu  Vaccine Information Statement (Interim) Inactivated Influenza Vaccine (07/07/2018)  This information is not intended to replace advice given to you by your health care provider. Make sure you discuss any questions you have with your health care provider.  Document Released: 09/03/2006 Document Revised: 07/11/2018 Document Reviewed: 07/11/2018  Elsevier Interactive Patient Education © 2019 [employer]

## 2018-11-22 NOTE — Telephone Encounter (Signed)
Attempted to call [facility] in [place]; no answer.  Will transfer Rx's on Flex[facility]il, Carafate, and [facility] [address] In [place], per pt. Request.

## 2018-11-22 NOTE — Progress Notes (Signed)
BP 126/84 (BP Location: Left Arm, Patient Position: Sitting, Cuff Size: Normal)   Pulse 99   Temp 98 F (36.7 C) (Oral)   Ht 5' 2.75" (1.594 m)   Wt 181 lb (82.1 kg)   LMP  (LMP Unknown)   SpO2 97%   BMI 32.32 kg/m    Subjective:    Patient ID: Stacy Phelps, female    DOB: [birth year], 47 y.o.   MRN: [id number]  HPI: Stacy Phelps is a 47 y.o. female presenting on 11/22/2018 for comprehensive medical examination. Current medical complaints include:see below  Worsening indigestion and an epigastric burning pain the past few weeks. Sometimes getting nauseated when eating. Hx of gastric ulcer when she was about 47 years old. Takes aleve daily for headaches. Not trying anything OTC for relief. No hematemesis, weight loss, fevers, diarrhea, constipation.   Hx of seasonal allergies, previously on singulair but did not notice a major benefit with this addition so now just on nasonex and claritin. Having more frequent frontal headaches that sometimes come with nausea. Does note she's had these for years and aleve takes them away. No photophobia, phonophobia, auras, hx of migraines.   She currently lives with: Menopausal Symptoms: no  Depression Screen done today and results listed below:  Depression screen [facility] 2/9 11/19/2017 02/23/2017 10/06/2016 10/04/2015  Decreased Interest 0 0 0 0  Down, Depressed, Hopeless 0 0 0 0  PHQ - 2 Score 0 0 0 0  Altered sleeping 0 - 0 -  Tired, decreased energy 0 - 0 -  Change in appetite 0 - 0 -  Feeling bad or failure about yourself  0 - 0 -  Trouble concentrating 0 - 0 -  Moving slowly or fidgety/restless 0 - 0 -  Suicidal thoughts 0 - 0 -  PHQ-9 Score 0 - 0 -    The patient does not have a history of falls. I did not complete a risk assessment for falls. A plan of care for falls was not documented.   Past Medical History:  Past Medical History:  Diagnosis Date  . Allergy   . Anxiety   . Depression   . Gout   . Gout   . Obesity      Surgical History:  Past Surgical History:  Procedure Laterality Date  . ABDOMINAL HYSTERECTOMY    . CHOLECYSTECTOMY      Medications:  Current Outpatient Medications on File Prior to Visit  Medication Sig  . BIOTIN PO Take by mouth daily. Hair, Skin, and Nails  . loratadine (CLARITIN) 10 MG tablet Take 10 mg by mouth daily.  . mometasone (NASONEX) 50 MCG/ACT nasal spray SHAKE LIQUID AND USE 2 SPRAYS IN EACH NOSTRIL DAILY  . Probiotic Product (PROBIOTIC PO) Take by mouth daily.   No current facility-administered medications on file prior to visit.     Allergies:  Allergies  Allergen Reactions  . Acetaminophen Nausea And Vomiting    Social History:  Social History   Socioeconomic History  . Marital status: Married    Spouse name: Not on file  . Number of children: Not on file  . Years of education: Not on file  . Highest education level: Not on file  Occupational History  . Not on file  Social Needs  . Financial resource strain: Not on file  . Food insecurity:    Worry: Not on file    Inability: Not on file  . Transportation needs:    Medical: Not on  file    Non-medical: Not on file  Tobacco Use  . Smoking status: Never Smoker  . Smokeless tobacco: Never Used  Substance and Sexual Activity  . Alcohol use: No    Alcohol/week: 0.0 standard drinks  . Drug use: No  . Sexual activity: Yes    Birth control/protection: Surgical  Lifestyle  . Physical activity:    Days per week: Not on file    Minutes per session: Not on file  . Stress: Not on file  Relationships  . Social connections:    Talks on phone: Not on file    Gets together: Not on file    Attends religious service: Not on file    Active member of club or organization: Not on file    Attends meetings of clubs or organizations: Not on file    Relationship status: Not on file  . Intimate partner violence:    Fear of current or ex partner: Not on file    Emotionally abused: Not on file     Physically abused: Not on file    Forced sexual activity: Not on file  Other Topics Concern  . Not on file  Social History Narrative  . Not on file   Social History   Tobacco Use  Smoking Status Never Smoker  Smokeless Tobacco Never Used   Social History   Substance and Sexual Activity  Alcohol Use No  . Alcohol/week: 0.0 standard drinks    Family History:  Family History  Problem Relation Age of Onset  . Diabetes Mother   . Thyroid disease Mother   . Hypertension Father   . Heart disease Father   . Diabetes Maternal Grandmother   . Hypertension Paternal Grandmother   . Stroke Paternal Grandmother   . Hypertension Paternal Grandfather   . Cancer Paternal Grandfather        bone  . Anxiety disorder Sister   . Breast cancer Neg Hx     Past medical history, surgical history, medications, allergies, family history and social history reviewed with patient today and changes made to appropriate areas of the chart.   Review of Systems - General ROS: negative Psychological ROS: negative Ophthalmic ROS: negative ENT ROS: negative Allergy and Immunology ROS: positive for - seasonal allergies Hematological and Lymphatic ROS: negative Endocrine ROS: negative Breast ROS: negative for breast lumps Respiratory ROS: no cough, shortness of breath, or wheezing Cardiovascular ROS: no chest pain or dyspnea on exertion Gastrointestinal ROS: no abdominal pain, change in bowel habits, or black or bloody stools Genito-Urinary ROS: no dysuria, trouble voiding, or hematuria Musculoskeletal ROS: negative Neurological ROS: positive for - headaches Dermatological ROS: negative All other ROS negative except what is listed above and in the HPI.      Objective:    BP 126/84 (BP Location: Left Arm, Patient Position: Sitting, Cuff Size: Normal)   Pulse 99   Temp 98 F (36.7 C) (Oral)   Ht 5' 2.75" (1.594 m)   Wt 181 lb (82.1 kg)   LMP  (LMP Unknown)   SpO2 97%   BMI 32.32 kg/m   Wt  Readings from Last 3 Encounters:  11/22/18 181 lb (82.1 kg)  05/02/18 183 lb (83 kg)  11/19/17 174 lb (78.9 kg)    Physical Exam Vitals signs and nursing note reviewed.  Constitutional:      General: She is not in acute distress.    Appearance: She is well-developed.  HENT:     Head: Atraumatic.  Right Ear: External ear normal.     Left Ear: External ear normal.     Nose: Nose normal.     Mouth/Throat:     Pharynx: No oropharyngeal exudate.  Eyes:     General: No scleral icterus.    Conjunctiva/sclera: Conjunctivae normal.     Pupils: Pupils are equal, round, and reactive to light.  Neck:     Musculoskeletal: Normal range of motion and neck supple.     Thyroid: No thyromegaly.  Cardiovascular:     Rate and Rhythm: Normal rate and regular rhythm.     Heart sounds: Normal heart sounds.  Pulmonary:     Effort: Pulmonary effort is normal. No respiratory distress.     Breath sounds: Normal breath sounds.  Chest:     Breasts:        Right: No mass, nipple discharge, skin change or tenderness.        Left: No mass, nipple discharge, skin change or tenderness.  Abdominal:     General: Bowel sounds are normal.     Palpations: Abdomen is soft. There is no mass.     Tenderness: There is no abdominal tenderness.  Musculoskeletal: Normal range of motion.        General: No tenderness.  Lymphadenopathy:     Cervical: No cervical adenopathy.     Upper Body:     Right upper body: No axillary adenopathy.     Left upper body: No axillary adenopathy.  Skin:    General: Skin is warm and dry.     Findings: No rash.  Neurological:     Mental Status: She is alert and oriented to person, place, and time.     Cranial Nerves: No cranial nerve deficit.  Psychiatric:        Behavior: Behavior normal.     Results for orders placed or performed in visit on 11/22/18  CBC with Differential/Platelet  Result Value Ref Range   WBC 5.0 3.4 - 10.8 x10E3/uL   RBC 4.14 3.77 - 5.28 x10E6/uL    Hemoglobin 12.9 11.1 - 15.9 g/dL   Hematocrit 16.1 09.6 - 46.6 %   MCV 88 79 - 97 fL   MCH 31.2 26.6 - 33.0 pg   MCHC 35.5 31.5 - 35.7 g/dL   RDW 04.5 (L) 40.9 - 81.1 %   Platelets 256 150 - 450 x10E3/uL   Neutrophils 68 Not Estab. %   Lymphs 21 Not Estab. %   Monocytes 8 Not Estab. %   Eos 2 Not Estab. %   Basos 1 Not Estab. %   Neutrophils Absolute 3.4 1.4 - 7.0 x10E3/uL   Lymphocytes Absolute 1.1 0.7 - 3.1 x10E3/uL   Monocytes Absolute 0.4 0.1 - 0.9 x10E3/uL   EOS (ABSOLUTE) 0.1 0.0 - 0.4 x10E3/uL   Basophils Absolute 0.0 0.0 - 0.2 x10E3/uL   Immature Granulocytes 0 Not Estab. %   Immature Grans (Abs) 0.0 0.0 - 0.1 x10E3/uL  Comprehensive metabolic panel  Result Value Ref Range   Glucose 98 65 - 99 mg/dL   BUN 14 6 - 24 mg/dL   Creatinine, Ser 9.14 0.57 - 1.00 mg/dL   GFR calc non Af Amer 105 >59 mL/min/1.73   GFR calc Af Amer 121 >59 mL/min/1.73   BUN/Creatinine Ratio 22 9 - 23   Sodium 140 134 - 144 mmol/L   Potassium 4.0 3.5 - 5.2 mmol/L   Chloride 100 96 - 106 mmol/L   CO2 23 20 - 29 mmol/L  Calcium 9.1 8.7 - 10.2 mg/dL   Total Protein 7.2 6.0 - 8.5 g/dL   Albumin 4.6 3.5 - 5.5 g/dL   Globulin, Total 2.6 1.5 - 4.5 g/dL   Albumin/Globulin Ratio 1.8 1.2 - 2.2   Bilirubin Total 0.4 0.0 - 1.2 mg/dL   Alkaline Phosphatase 77 39 - 117 IU/L   AST 22 0 - 40 IU/L   ALT 30 0 - 32 IU/L  Lipid Panel w/o Chol/HDL Ratio  Result Value Ref Range   Cholesterol, Total 196 100 - 199 mg/dL   Triglycerides 87 0 - 149 mg/dL   HDL 53 >16>39 mg/dL   VLDL Cholesterol Cal 17 5 - 40 mg/dL   LDL Calculated 109126 (H) 0 - 99 mg/dL  TSH  Result Value Ref Range   TSH 2.730 0.450 - 4.500 uIU/mL  UA/M w/rflx Culture, Routine  Result Value Ref Range   Specific Gravity, UA 1.015 1.005 - 1.030   pH, UA 7.0 5.0 - 7.5   Color, UA Yellow Yellow   Appearance Ur Clear Clear   Leukocytes, UA Negative Negative   Protein, UA Negative Negative/Trace   Glucose, UA Negative Negative   Ketones, UA  Negative Negative   RBC, UA Negative Negative   Bilirubin, UA Negative Negative   Urobilinogen, Ur 1.0 0.2 - 1.0 mg/dL   Nitrite, UA Negative Negative      Assessment & Plan:   Problem List Items Addressed This Visit      Respiratory   Allergic rhinitis    Continue nasonex and claritin, does not want to re-add singulair. Humidifier, sinus rinses.        Other Visit Diagnoses    Epigastric pain    -  Primary   Suspect gastritis, start 4-6 weeks of protonix with prn carafate. Bland diet, avoid triggers including NSAIDs for now.    Annual physical exam       Relevant Orders   CBC with Differential/Platelet (Completed)   Comprehensive metabolic panel (Completed)   Lipid Panel w/o Chol/HDL Ratio (Completed)   TSH (Completed)   UA/M w/rflx Culture, Routine (Completed)   Episodic tension-type headache, not intractable       Tension vs sinus - will trial bedtime flexeril and neck massage as well as sinus rinses and humidifier. Declines re-adding singulair   Need for influenza vaccination       Relevant Orders   Flu Vaccine QUAD 6+ mos PF IM (Fluarix Quad PF) (Completed)       Follow up plan: Return in about 1 year (around 11/23/2019) for CPE.   LABORATORY TESTING:  - Pap smear: not applicable  IMMUNIZATIONS:   - Tdap: Tetanus vaccination status reviewed: last tetanus booster within 10 years. - Influenza: Up to date  SCREENING: -Mammogram: Up to date   PATIENT COUNSELING:   Advised to take 1 mg of folate supplement per day if capable of pregnancy.   Sexuality: Discussed sexually transmitted diseases, partner selection, use of condoms, avoidance of unintended pregnancy  and contraceptive alternatives.   Advised to avoid cigarette smoking.  I discussed with the patient that most people either abstain from alcohol or drink within safe limits (<=14/week and <=4 drinks/occasion for males, <=7/weeks and <= 3 drinks/occasion for females) and that the risk for alcohol disorders  and other health effects rises proportionally with the number of drinks per week and how often a drinker exceeds daily limits.  Discussed cessation/primary prevention of drug use and availability of treatment for abuse.  Diet: Encouraged to adjust caloric intake to maintain  or achieve ideal body weight, to reduce intake of dietary saturated fat and total fat, to limit sodium intake by avoiding high sodium foods and not adding table salt, and to maintain adequate dietary potassium and calcium preferably from fresh fruits, vegetables, and low-fat dairy products.    stressed the importance of regular exercise  Injury prevention: Discussed safety belts, safety helmets, smoke detector, smoking near bedding or upholstery.   Dental health: Discussed importance of regular tooth brushing, flossing, and dental visits.    NEXT PREVENTATIVE PHYSICAL DUE IN 1 YEAR. Return in about 1 year (around 11/23/2019) for CPE.

## 2018-11-22 NOTE — Telephone Encounter (Signed)
Call placed to pt.  Advised [facility] [address] in [place], per her request.    Advised pt., nurse was not able to get through, on phone,  to the [facility] in [place], to cancel the Flexeril, Carafate, and Protonix. Verb. Understanding.

## 2018-11-23 ENCOUNTER — Encounter: Payer: Self-pay | Admitting: Family Medicine

## 2018-11-23 LAB — CBC WITH DIFFERENTIAL/PLATELET
Basophils Absolute: 0 10*3/uL (ref 0.0–0.2)
Basos: 1 %
EOS (ABSOLUTE): 0.1 10*3/uL (ref 0.0–0.4)
EOS: 2 %
HEMATOCRIT: 36.3 % (ref 34.0–46.6)
Hemoglobin: 12.9 g/dL (ref 11.1–15.9)
Immature Grans (Abs): 0 10*3/uL (ref 0.0–0.1)
Immature Granulocytes: 0 %
LYMPHS ABS: 1.1 10*3/uL (ref 0.7–3.1)
Lymphs: 21 %
MCH: 31.2 pg (ref 26.6–33.0)
MCHC: 35.5 g/dL (ref 31.5–35.7)
MCV: 88 fL (ref 79–97)
MONOS ABS: 0.4 10*3/uL (ref 0.1–0.9)
Monocytes: 8 %
Neutrophils Absolute: 3.4 10*3/uL (ref 1.4–7.0)
Neutrophils: 68 %
Platelets: 256 10*3/uL (ref 150–450)
RBC: 4.14 x10E6/uL (ref 3.77–5.28)
RDW: 11.7 % — AB (ref 12.3–15.4)
WBC: 5 10*3/uL (ref 3.4–10.8)

## 2018-11-23 LAB — COMPREHENSIVE METABOLIC PANEL
A/G RATIO: 1.8 (ref 1.2–2.2)
ALK PHOS: 77 IU/L (ref 39–117)
ALT: 30 IU/L (ref 0–32)
AST: 22 IU/L (ref 0–40)
Albumin: 4.6 g/dL (ref 3.5–5.5)
BILIRUBIN TOTAL: 0.4 mg/dL (ref 0.0–1.2)
BUN/Creatinine Ratio: 22 (ref 9–23)
BUN: 14 mg/dL (ref 6–24)
CHLORIDE: 100 mmol/L (ref 96–106)
CO2: 23 mmol/L (ref 20–29)
Calcium: 9.1 mg/dL (ref 8.7–10.2)
Creatinine, Ser: 0.65 mg/dL (ref 0.57–1.00)
GFR calc Af Amer: 121 mL/min/{1.73_m2} (ref >59)
GFR calc non Af Amer: 105 mL/min/{1.73_m2} (ref >59)
GLOBULIN, TOTAL: 2.6 g/dL (ref 1.5–4.5)
Glucose: 98 mg/dL (ref 65–99)
POTASSIUM: 4 mmol/L (ref 3.5–5.2)
SODIUM: 140 mmol/L (ref 134–144)
Total Protein: 7.2 g/dL (ref 6.0–8.5)

## 2018-11-23 LAB — LIPID PANEL W/O CHOL/HDL RATIO
CHOLESTEROL TOTAL: 196 mg/dL (ref 100–199)
HDL: 53 mg/dL (ref >39)
LDL Calculated: 126 mg/dL — ABNORMAL HIGH (ref 0–99)
TRIGLYCERIDES: 87 mg/dL (ref 0–149)
VLDL Cholesterol Cal: 17 mg/dL (ref 5–40)

## 2018-11-23 LAB — TSH: TSH: 2.73 u[IU]/mL (ref 0.450–4.500)

## 2018-11-23 NOTE — Assessment & Plan Note (Signed)
Continue nasonex and claritin, does not want to re-add singulair. Humidifier, sinus rinses.

## 2018-11-27 ENCOUNTER — Encounter: Payer: Self-pay | Admitting: Family Medicine

## 2018-12-17 ENCOUNTER — Other Ambulatory Visit: Payer: Self-pay | Admitting: Family Medicine

## 2018-12-19 NOTE — Telephone Encounter (Signed)
Requested medication (s) are due for refill today: Yes  Requested medication (s) are on the active medication list: Yes  Last refill:  11/22/18  Future visit scheduled: No  Notes to clinic:  Unsure if Fleet Contras wants the patient to continue, did not refill, needs approval.     Requested Prescriptions  Pending Prescriptions Disp Refills   sucralfate (CARAFATE) 1 g tablet [Pharmacy Med Name: SUCRALFATE 1GM TABLETS] 120 tablet 0    Sig: TAKE 1 TABLET BY MOUTH FOUR TIMES DAILY(WITH MEALS AND AT BEDTIME)     Gastroenterology: Antiacids Passed - 12/17/2018 10:21 AM      Passed - Valid encounter within last 12 months    Recent Outpatient Visits          3 weeks ago Epigastric pain   [facility] Particia Nearing, [place]   7 months ago Urinary tract infection without hematuria, site unspecified   [facility] [place], Redge Gainer, MD   1 year ago Need for influenza vaccination   [facility] Gabriel Cirri, NP   1 year ago Class 1 obesity due to excess calories without serious comorbidity with body mass index (BMI) of 30.0 to 30.9 in adult   [facility] Gabriel Cirri, NP   2 years ago Need for influenza vaccination   [facility] Gabriel Cirri, NP

## 2019-01-10 ENCOUNTER — Encounter: Payer: Self-pay | Admitting: Family Medicine

## 2019-01-16 ENCOUNTER — Encounter: Payer: Self-pay | Admitting: Family Medicine

## 2019-01-16 ENCOUNTER — Ambulatory Visit: Payer: BC Managed Care – PPO | Admitting: Family Medicine

## 2019-01-16 VITALS — BP 137/83 | HR 81 | Temp 99.2°F | Ht 62.75 in | Wt 164.3 lb

## 2019-01-16 DIAGNOSIS — R109 Unspecified abdominal pain: Secondary | ICD-10-CM

## 2019-01-16 LAB — UA/M W/RFLX CULTURE, ROUTINE
Bilirubin, UA: NEGATIVE
GLUCOSE, UA: NEGATIVE
Ketones, UA: NEGATIVE
Leukocytes, UA: NEGATIVE
Nitrite, UA: NEGATIVE
Protein, UA: NEGATIVE
RBC, UA: NEGATIVE
Specific Gravity, UA: >1.03 — ABNORMAL HIGH (ref 1.005–1.030)
UUROB: 0.2 mg/dL (ref 0.2–1.0)
pH, UA: 5.5 (ref 5.0–7.5)

## 2019-01-16 NOTE — Progress Notes (Signed)
BP 137/83 (BP Location: Left Arm, Patient Position: Sitting, Cuff Size: Normal)   Pulse 81   Temp 99.2 F (37.3 C)   Ht 5' 2.75" (1.594 m)   Wt 164 lb 5 oz (74.5 kg)   LMP  (LMP Unknown)   SpO2 99%   BMI 29.34 kg/m    Subjective:    Patient ID: Stacy Phelps, female    DOB: 1971/09/17, 48 y.o.   MRN: [id number]  HPI: Stacy Phelps is a 47 y.o. female  Chief Complaint  Patient presents with  . Heartburn  . Abdominal Pain    right lower, crampy feels that is continous, but pain will intensify at tiems, regual bowel movements   Several months of right lower abdominal cramping pain. Denies N/V/D, fevers, chills. States pain is continuous but will become sharp and intense at times without warning. Has not tried anything for relief of this. Hx of ovarian cyst on other side, concerned this may be going on again. Does also have some urine odor and frequency for several weeks now.   Not as much burning pain in upper abdomen, protonix and carafate helping. Has made major diet changes which seems to help some.   Relevant past medical, surgical, family and social history reviewed and updated as indicated. Interim medical history since our last visit reviewed. Allergies and medications reviewed and updated.  Review of Systems  Per HPI unless specifically indicated above     Objective:    BP 137/83 (BP Location: Left Arm, Patient Position: Sitting, Cuff Size: Normal)   Pulse 81   Temp 99.2 F (37.3 C)   Ht 5' 2.75" (1.594 m)   Wt 164 lb 5 oz (74.5 kg)   LMP  (LMP Unknown)   SpO2 99%   BMI 29.34 kg/m   Wt Readings from Last 3 Encounters:  01/16/19 164 lb 5 oz (74.5 kg)  11/22/18 181 lb (82.1 kg)  05/02/18 183 lb (83 kg)    Physical Exam Vitals signs and nursing note reviewed.  Constitutional:      Appearance: Normal appearance. She is not ill-appearing.  HENT:     Head: Atraumatic.  Eyes:     Extraocular Movements: Extraocular movements intact.     Conjunctiva/sclera:  Conjunctivae normal.  Neck:     Musculoskeletal: Normal range of motion and neck supple.  Cardiovascular:     Rate and Rhythm: Normal rate and regular rhythm.     Heart sounds: Normal heart sounds.  Pulmonary:     Effort: Pulmonary effort is normal.     Breath sounds: Normal breath sounds.  Abdominal:     General: Bowel sounds are normal.     Palpations: Abdomen is soft. There is no mass.     Tenderness: There is abdominal tenderness (b/l lower abdominal pain, mild). There is no right CVA tenderness, left CVA tenderness or guarding.  Musculoskeletal: Normal range of motion.  Skin:    General: Skin is warm and dry.  Neurological:     Mental Status: She is alert and oriented to person, place, and time.  Psychiatric:        Mood and Affect: Mood normal.        Thought Content: Thought content normal.        Judgment: Judgment normal.     Results for orders placed or performed in visit on 01/16/19  Helicobacter pylori abs-IgG+IgA, bld  Result Value Ref Range   H. pylori, IgA Abs <9.0 0.0 - 8.9  units   H. pylori, IgG AbS 0.40 0.00 - 0.79 Index Value  UA/M w/rflx Culture, Routine  Result Value Ref Range   Specific Gravity, UA >1.030 (H) 1.005 - 1.030   pH, UA 5.5 5.0 - 7.5   Color, UA Yellow Yellow   Appearance Ur Clear Clear   Leukocytes, UA Negative Negative   Protein, UA Negative Negative/Trace   Glucose, UA Negative Negative   Ketones, UA Negative Negative   RBC, UA Negative Negative   Bilirubin, UA Negative Negative   Urobilinogen, Ur 0.2 0.2 - 1.0 mg/dL   Nitrite, UA Negative Negative  CBC with Differential/Platelet  Result Value Ref Range   WBC 4.0 3.4 - 10.8 x10E3/uL   RBC 4.20 3.77 - 5.28 x10E6/uL   Hemoglobin 13.0 11.1 - 15.9 g/dL   Hematocrit 34.9 17.9 - 46.6 %   MCV 88 79 - 97 fL   MCH 31.0 26.6 - 33.0 pg   MCHC 35.3 31.5 - 35.7 g/dL   RDW 15.0 56.9 - 79.4 %   Platelets 203 150 - 450 x10E3/uL   Neutrophils 53 Not Estab. %   Lymphs 30 Not Estab. %    Monocytes 11 Not Estab. %   Eos 5 Not Estab. %   Basos 1 Not Estab. %   Neutrophils Absolute 2.1 1.4 - 7.0 x10E3/uL   Lymphocytes Absolute 1.2 0.7 - 3.1 x10E3/uL   Monocytes Absolute 0.4 0.1 - 0.9 x10E3/uL   EOS (ABSOLUTE) 0.2 0.0 - 0.4 x10E3/uL   Basophils Absolute 0.0 0.0 - 0.2 x10E3/uL   Immature Granulocytes 0 Not Estab. %   Immature Grans (Abs) 0.0 0.0 - 0.1 x10E3/uL      Assessment & Plan:   Problem List Items Addressed This Visit    None    Visit Diagnoses    Abdominal pain, unspecified abdominal location    -  Primary   Relevant Orders   Helicobacter pylori abs-IgG+IgA, bld (Completed)   UA/M w/rflx Culture, Routine (Completed)   CBC with Differential/Platelet (Completed)    Epigastric pain resolving for the most part with protonix, carafate, diet changes. COntinue prn use. Check H pylori labs per pt request.  Lower abdominal pain of unclear etiology. Will await U/A, CBC, and u/s pelvis (pt requesting to r/o cyst issues). F/u with GI if sxs not resolving or no causation found. Return precautions given  Greater than 25 min spent in direct patient care.   Follow up plan: Return if symptoms worsen or fail to improve.

## 2019-01-18 ENCOUNTER — Encounter: Payer: Self-pay | Admitting: Family Medicine

## 2019-01-18 LAB — CBC WITH DIFFERENTIAL/PLATELET
BASOS ABS: 0 10*3/uL (ref 0.0–0.2)
Basos: 1 %
EOS (ABSOLUTE): 0.2 10*3/uL (ref 0.0–0.4)
Eos: 5 %
Hematocrit: 36.8 % (ref 34.0–46.6)
Hemoglobin: 13 g/dL (ref 11.1–15.9)
Immature Grans (Abs): 0 10*3/uL (ref 0.0–0.1)
Immature Granulocytes: 0 %
Lymphocytes Absolute: 1.2 10*3/uL (ref 0.7–3.1)
Lymphs: 30 %
MCH: 31 pg (ref 26.6–33.0)
MCHC: 35.3 g/dL (ref 31.5–35.7)
MCV: 88 fL (ref 79–97)
Monocytes Absolute: 0.4 10*3/uL (ref 0.1–0.9)
Monocytes: 11 %
NEUTROS PCT: 53 %
Neutrophils Absolute: 2.1 10*3/uL (ref 1.4–7.0)
PLATELETS: 203 10*3/uL (ref 150–450)
RBC: 4.2 x10E6/uL (ref 3.77–5.28)
RDW: 12.2 % (ref 11.7–15.4)
WBC: 4 10*3/uL (ref 3.4–10.8)

## 2019-01-18 LAB — HELICOBACTER PYLORI ABS-IGG+IGA, BLD
H. pylori, IgA Abs: <9 units (ref 0.0–8.9)
H. pylori, IgG AbS: 0.4 Index Value (ref 0.00–0.79)

## 2019-01-20 ENCOUNTER — Encounter: Payer: Self-pay | Admitting: Family Medicine

## 2019-04-06 ENCOUNTER — Other Ambulatory Visit: Payer: Self-pay | Admitting: Unknown Physician Specialty

## 2019-06-19 ENCOUNTER — Other Ambulatory Visit: Payer: Self-pay | Admitting: Family Medicine

## 2019-08-07 ENCOUNTER — Telehealth: Payer: Self-pay

## 2019-08-07 MED ORDER — MOMETASONE FUROATE 50 MCG/ACT NA SUSP: 2.0000 | Freq: Every day | NASAL | 11 refills | Status: DC

## 2019-08-07 NOTE — Telephone Encounter (Signed)
Patient is requesting a prescription for mometasone 83mcg nasal spray

## 2019-08-07 NOTE — Telephone Encounter (Signed)
Rx sent 

## 2019-09-04 ENCOUNTER — Telehealth: Payer: Self-pay | Admitting: Family Medicine

## 2019-09-04 DIAGNOSIS — Z1231 Encounter for screening mammogram for malignant neoplasm of breast: Secondary | ICD-10-CM

## 2019-09-04 NOTE — Telephone Encounter (Signed)
Patient scheduled annual mamo with PCP for 12/06/2019 and would like mamo orders placed prior, please advise patient when order is placed

## 2019-09-05 NOTE — Telephone Encounter (Signed)
Order placed

## 2019-09-05 NOTE — Telephone Encounter (Signed)
Pt.notified

## 2019-09-20 ENCOUNTER — Ambulatory Visit (INDEPENDENT_AMBULATORY_CARE_PROVIDER_SITE_OTHER): Payer: BC Managed Care – PPO | Admitting: Family Medicine

## 2019-09-20 ENCOUNTER — Encounter: Payer: Self-pay | Admitting: Family Medicine

## 2019-09-20 ENCOUNTER — Other Ambulatory Visit: Payer: Self-pay

## 2019-09-20 VITALS — Ht 61.0 in | Wt 177.0 lb

## 2019-09-20 DIAGNOSIS — Z23 Encounter for immunization: Secondary | ICD-10-CM

## 2019-09-20 DIAGNOSIS — N760 Acute vaginitis: Secondary | ICD-10-CM | POA: Diagnosis not present

## 2019-09-20 DIAGNOSIS — R300 Dysuria: Secondary | ICD-10-CM | POA: Diagnosis not present

## 2019-09-20 DIAGNOSIS — B9689 Other specified bacterial agents as the cause of diseases classified elsewhere: Secondary | ICD-10-CM | POA: Diagnosis not present

## 2019-09-20 LAB — WET PREP FOR TRICH, YEAST, CLUE
Clue Cell Exam: POSITIVE — AB
Trichomonas Exam: NEGATIVE
Yeast Exam: NEGATIVE

## 2019-09-20 MED ORDER — METRONIDAZOLE 500 MG PO TABS: 500.0000 mg | ORAL_TABLET | Freq: Two times a day (BID) | ORAL | 0 refills | Status: DC

## 2019-09-20 NOTE — Progress Notes (Signed)
Ht 5\' 1"  (1.549 m)   Wt 177 lb (80.3 kg)   LMP  (LMP Unknown)   BMI 33.44 kg/m    Subjective:    Patient ID: Stacy Phelps, female    DOB: 06/26/1971, 48 y.o.   MRN: [id number]  HPI: Stacy Phelps is a 48 y.o. female  Chief Complaint  Patient presents with  . Dysuria    burning and itchy when urinate. x 4 days  . Urinary Frequency   Dysuria, vaginal irritation, urgency, and itching for about 4 days now. Denies abdominal pain, fevers, chills, hematuria, rashes, concern for STIs. Not trying anything OTC for relief.   Relevant past medical, surgical, family and social history reviewed and updated as indicated. Interim medical history since our last visit reviewed. Allergies and medications reviewed and updated.  Review of Systems  Per HPI unless specifically indicated above     Objective:    Ht 5\' 1"  (1.549 m)   Wt 177 lb (80.3 kg)   LMP  (LMP Unknown)   BMI 33.44 kg/m   Wt Readings from Last 3 Encounters:  09/20/19 177 lb (80.3 kg)  01/16/19 164 lb 5 oz (74.5 kg)  11/22/18 181 lb (82.1 kg)    Physical Exam Vitals signs and nursing note reviewed.  Constitutional:      Appearance: Normal appearance. She is not ill-appearing.  HENT:     Head: Atraumatic.  Eyes:     Extraocular Movements: Extraocular movements intact.     Conjunctiva/sclera: Conjunctivae normal.  Neck:     Musculoskeletal: Normal range of motion and neck supple.  Cardiovascular:     Rate and Rhythm: Normal rate and regular rhythm.     Heart sounds: Normal heart sounds.  Pulmonary:     Effort: Pulmonary effort is normal.     Breath sounds: Normal breath sounds.  Abdominal:     General: Bowel sounds are normal. There is no distension.     Palpations: Abdomen is soft.     Tenderness: There is no abdominal tenderness. There is no right CVA tenderness, left CVA tenderness or guarding.  Genitourinary:    Vagina: Vaginal discharge present.  Musculoskeletal: Normal range of motion.  Skin:  General: Skin is warm and dry.  Neurological:     Mental Status: She is alert and oriented to person, place, and time.  Psychiatric:        Mood and Affect: Mood normal.        Thought Content: Thought content normal.        Judgment: Judgment normal.     Results for orders placed or performed in visit on 09/20/19  WET PREP FOR TRICH, YEAST, CLUE   Specimen: Urine   URINE  Result Value Ref Range   Trichomonas Exam Negative Negative   Yeast Exam Negative Negative   Clue Cell Exam Positive (A) Negative  Microscopic Examination   URINE  Result Value Ref Range   WBC, UA 0-5 0 - 5 /hpf   RBC None seen 0 - 2 /hpf   Epithelial Cells (non renal) 0-10 0 - 10 /hpf   Bacteria, UA Few (A) None seen/Few  Urine Culture, Reflex   URINE  Result Value Ref Range   Urine Culture, Routine Final report    Organism ID, Bacteria Comment   UA/M w/rflx Culture, Routine   Specimen: Urine   URINE  Result Value Ref Range   Specific Gravity, UA 1.010 1.005 - 1.030   pH, UA 7.0  5.0 - 7.5   Color, UA Yellow Yellow   Appearance Ur Clear Clear   Leukocytes,UA Trace (A) Negative   Protein,UA Negative Negative/Trace   Glucose, UA Negative Negative   Ketones, UA Negative Negative   RBC, UA Negative Negative   Bilirubin, UA Negative Negative   Urobilinogen, Ur 0.2 0.2 - 1.0 mg/dL   Nitrite, UA Negative Negative   Microscopic Examination See below:    Urinalysis Reflex Comment       Assessment & Plan:   Problem List Items Addressed This Visit    None    Visit Diagnoses    BV (bacterial vaginosis)    -  Primary   Tx with flagyl, good vaginal hygiene reviewed. F/u if not improving   Relevant Medications   metroNIDAZOLE (FLAGYL) 500 MG tablet   Other Relevant Orders   WET PREP FOR [place], YEAST, CLUE (Completed)   Dysuria       Relevant Orders   UA/M w/rflx Culture, Routine (Completed)   Flu vaccine need       Relevant Orders   Flu Vaccine QUAD 36+ mos IM (Completed)       Follow up  plan: Return if symptoms worsen or fail to improve.

## 2019-09-22 LAB — MICROSCOPIC EXAMINATION: RBC, Urine: NONE SEEN /hpf (ref 0–2)

## 2019-09-22 LAB — UA/M W/RFLX CULTURE, ROUTINE
Bilirubin, UA: NEGATIVE
Glucose, UA: NEGATIVE
Ketones, UA: NEGATIVE
Nitrite, UA: NEGATIVE
Protein,UA: NEGATIVE
RBC, UA: NEGATIVE
Specific Gravity, UA: 1.01 (ref 1.005–1.030)
Urobilinogen, Ur: 0.2 mg/dL (ref 0.2–1.0)
pH, UA: 7 (ref 5.0–7.5)

## 2019-09-22 LAB — URINE CULTURE, REFLEX

## 2019-09-26 ENCOUNTER — Other Ambulatory Visit: Payer: Self-pay | Admitting: Family Medicine

## 2019-09-26 MED ORDER — FLUCONAZOLE 150 MG PO TABS: ORAL_TABLET | ORAL | 0 refills | Status: DC

## 2019-10-10 ENCOUNTER — Other Ambulatory Visit: Payer: Self-pay | Admitting: Family Medicine

## 2019-10-10 MED ORDER — METRONIDAZOLE 500 MG PO TABS: 500.0000 mg | ORAL_TABLET | Freq: Two times a day (BID) | ORAL | 0 refills | Status: DC

## 2019-10-23 ENCOUNTER — Ambulatory Visit
Admission: RE | Admit: 2019-10-23 | Discharge: 2019-10-23 | Disposition: A | Discharge status: Home / Self Care | Payer: BC Managed Care – PPO | Source: Ambulatory Visit | Attending: Family Medicine | Admitting: Family Medicine

## 2019-10-23 DIAGNOSIS — Z1231 Encounter for screening mammogram for malignant neoplasm of breast: Secondary | ICD-10-CM

## 2019-10-31 ENCOUNTER — Other Ambulatory Visit: Payer: Self-pay

## 2019-10-31 DIAGNOSIS — Z20822 Contact with and (suspected) exposure to covid-19: Secondary | ICD-10-CM

## 2019-11-02 ENCOUNTER — Ambulatory Visit: Payer: Self-pay

## 2019-11-02 LAB — NOVEL CORONAVIRUS, NAA: SARS-CoV-2, NAA: DETECTED — AB

## 2019-11-02 NOTE — Telephone Encounter (Signed)
Pt. Has questions about COVID 19 quarantine. See results note documentation.

## 2019-11-23 ENCOUNTER — Encounter: Payer: Self-pay | Admitting: Family Medicine

## 2019-12-05 ENCOUNTER — Telehealth: Payer: Self-pay | Admitting: Family Medicine

## 2019-12-05 NOTE — Telephone Encounter (Signed)
Called to go over script screening pt tested positive 11/02/19 out of 30 day window however she still has no taste or smell. Is it ok that she comes in tomorrow? Please advise.

## 2019-12-05 NOTE — Telephone Encounter (Signed)
Lvm for pt that pt could come in for appt.

## 2019-12-05 NOTE — Telephone Encounter (Signed)
Ok to come in

## 2019-12-05 NOTE — Telephone Encounter (Signed)
Pt returned call and confirmed that she will come in to the office for the appt

## 2019-12-06 ENCOUNTER — Encounter: Payer: Self-pay | Admitting: Family Medicine

## 2019-12-06 ENCOUNTER — Ambulatory Visit (INDEPENDENT_AMBULATORY_CARE_PROVIDER_SITE_OTHER): Payer: BC Managed Care – PPO | Admitting: Family Medicine

## 2019-12-06 ENCOUNTER — Other Ambulatory Visit: Payer: Self-pay

## 2019-12-06 VITALS — BP 134/87 | HR 78 | Temp 98.1°F | Ht 62.5 in | Wt 180.0 lb

## 2019-12-06 DIAGNOSIS — J3089 Other allergic rhinitis: Secondary | ICD-10-CM | POA: Diagnosis not present

## 2019-12-06 DIAGNOSIS — R2232 Localized swelling, mass and lump, left upper limb: Secondary | ICD-10-CM

## 2019-12-06 DIAGNOSIS — Z0000 Encounter for general adult medical examination without abnormal findings: Secondary | ICD-10-CM | POA: Diagnosis not present

## 2019-12-06 DIAGNOSIS — K219 Gastro-esophageal reflux disease without esophagitis: Secondary | ICD-10-CM | POA: Diagnosis not present

## 2019-12-06 NOTE — Progress Notes (Signed)
BP 134/87   Pulse 78   Temp 98.1 F (36.7 C) (Oral)   Ht 5' 2.5" (1.588 m)   Wt 180 lb (81.6 kg)   LMP  (LMP Unknown)   SpO2 99%   BMI 32.40 kg/m    Subjective:    Patient ID: Stacy Phelps, female    DOB: 10/21/1971, 49 y.o.   MRN: [id number]  HPI: Stacy Amourammy R Stamps is a 49 y.o. female presenting on 12/06/2019 for comprehensive medical examination. Current medical complaints include:see below  GERD sxs, burning in throat. Stopped taking her protonix and carafate after her epigastric pain resolved. Denies N/V, melena, fevers.   Had a spider bite to left hand 2 weeks ago, area has improved significantly and not painful or red. No numbness, tingling, fevers. Has not been trying anything on area. Just some mild swelling remaining she's wanting checked out.   Allergies doing well on current regimen, not having significant breakthrough sxs.   She currently lives with: Menopausal Symptoms: no  Depression Screen done today and results listed below:  [facility] 12/06/2019 11/19/2017 02/23/2017 10/06/2016 10/04/2015  Decreased Interest 0 0 0 0 0  Down, Depressed, Hopeless 0 0 0 0 0  PHQ - 2 Score 0 0 0 0 0  Altered sleeping 0 0 - 0 -  Tired, decreased energy 0 0 - 0 -  Change in appetite 0 0 - 0 -  Feeling bad or failure about yourself  0 0 - 0 -  Trouble concentrating 0 0 - 0 -  Moving slowly or fidgety/restless 0 0 - 0 -  Suicidal thoughts 0 0 - 0 -  PHQ-9 Score 0 0 - 0 -    The patient does not have a history of falls. I did complete a risk assessment for falls. A plan of care for falls was documented.   Past Medical History:  Past Medical History:  Diagnosis Date  . Allergy   . Anxiety   . Depression   . Gout   . Gout   . Obesity     Surgical History:  Past Surgical History:  Procedure Laterality Date  . ABDOMINAL HYSTERECTOMY    . CHOLECYSTECTOMY      Medications:  Current Outpatient Medications on File Prior to Visit  Medication Sig  . Loratadine  (CLARITIN PO) Take by mouth daily.  . mometasone (NASONEX) 50 MCG/ACT nasal spray Place 2 sprays into the nose daily.  . pantoprazole (PROTONIX) 40 MG tablet Take 1 tablet (40 mg total) by mouth daily.  . Probiotic Product (PROBIOTIC PO) Take by mouth daily.  . sucralfate (CARAFATE) 1 g tablet TAKE 1 TABLET BY MOUTH FOUR TIMES DAILY(WITH MEALS AND AT BEDTIME) (Patient not taking: Reported on 01/16/2019)   No current facility-administered medications on file prior to visit.    Allergies:  Allergies  Allergen Reactions  . Acetaminophen Nausea And Vomiting    Social History:  Social History   Socioeconomic History  . Marital status: Married    Spouse name: Not on file  . Number of children: Not on file  . Years of education: Not on file  . Highest education level: Not on file  Occupational History  . Not on file  Tobacco Use  . Smoking status: Never Smoker  . Smokeless tobacco: Never Used  Substance and Sexual Activity  . Alcohol use: No    Alcohol/week: 0.0 standard drinks  . Drug use: No  . Sexual activity: Yes  Birth control/protection: Surgical  Other Topics Concern  . Not on file  Social History Narrative  . Not on file   Social Determinants of Health   Financial Resource Strain:   . Difficulty of Paying Living Expenses: Not on file  Food Insecurity:   . Worried About Charity fundraiser in the Last Year: Not on file  . Ran Out of Food in the Last Year: Not on file  Transportation Needs:   . Lack of Transportation (Medical): Not on file  . Lack of Transportation (Non-Medical): Not on file  Physical Activity:   . Days of Exercise per Week: Not on file  . Minutes of Exercise per Session: Not on file  Stress:   . Feeling of Stress : Not on file  Social Connections:   . Frequency of Communication with Friends and Family: Not on file  . Frequency of Social Gatherings with Friends and Family: Not on file  . Attends Religious Services: Not on file  . Active  Member of Clubs or Organizations: Not on file  . Attends Archivist Meetings: Not on file  . Marital Status: Not on file  Intimate Partner Violence:   . Fear of Current or Ex-Partner: Not on file  . Emotionally Abused: Not on file  . Physically Abused: Not on file  . Sexually Abused: Not on file   Social History   Tobacco Use  Smoking Status Never Smoker  Smokeless Tobacco Never Used   Social History   Substance and Sexual Activity  Alcohol Use No  . Alcohol/week: 0.0 standard drinks    Family History:  Family History  Problem Relation Age of Onset  . Diabetes Mother   . Thyroid disease Mother   . Hypertension Father   . Heart disease Father   . Diabetes Maternal Grandmother   . Hypertension Paternal Grandmother   . Stroke Paternal Grandmother   . Hypertension Paternal Grandfather   . Cancer Paternal Grandfather        bone  . Anxiety disorder Sister   . Breast cancer Neg Hx     Past medical history, surgical history, medications, allergies, family history and social history reviewed with patient today and changes made to appropriate areas of the chart.   Review of Systems - General ROS: negative Psychological ROS: negative Ophthalmic ROS: negative ENT ROS: negative Allergy and Immunology ROS: negative Hematological and Lymphatic ROS: negative Endocrine ROS: negative Breast ROS: negative for breast lumps Respiratory ROS: no cough, shortness of breath, or wheezing Cardiovascular ROS: no chest pain or dyspnea on exertion Gastrointestinal ROS: positive for - heartburn Genito-Urinary ROS: no dysuria, trouble voiding, or hematuria Musculoskeletal ROS: negative Neurological ROS: no TIA or stroke symptoms Dermatological ROS: negative All other ROS negative except what is listed above and in the HPI.      Objective:    BP 134/87   Pulse 78   Temp 98.1 F (36.7 C) (Oral)   Ht 5' 2.5" (1.588 m)   Wt 180 lb (81.6 kg)   LMP  (LMP Unknown)   SpO2 99%    BMI 32.40 kg/m   Wt Readings from Last 3 Encounters:  12/06/19 180 lb (81.6 kg)  09/20/19 177 lb (80.3 kg)  01/16/19 164 lb 5 oz (74.5 kg)    Physical Exam Vitals and nursing note reviewed.  Constitutional:      General: She is not in acute distress.    Appearance: She is well-developed.  HENT:  Head: Atraumatic.     Right Ear: External ear normal.     Left Ear: External ear normal.     Nose: Nose normal.     Mouth/Throat:     Pharynx: No oropharyngeal exudate.  Eyes:     General: No scleral icterus.    Conjunctiva/sclera: Conjunctivae normal.     Pupils: Pupils are equal, round, and reactive to light.  Neck:     Thyroid: No thyromegaly.  Cardiovascular:     Rate and Rhythm: Normal rate and regular rhythm.     Heart sounds: Normal heart sounds.  Pulmonary:     Effort: Pulmonary effort is normal. No respiratory distress.     Breath sounds: Normal breath sounds.  Chest:     Breasts:        Right: No mass, skin change or tenderness.        Left: No mass, skin change or tenderness.  Abdominal:     General: Bowel sounds are normal.     Palpations: Abdomen is soft. There is no mass.     Tenderness: There is no abdominal tenderness.  Genitourinary:    Comments: GU exam declined Musculoskeletal:        General: No tenderness. Normal range of motion.     Cervical back: Normal range of motion and neck supple.  Lymphadenopathy:     Cervical: No cervical adenopathy.     Upper Body:     Right upper body: No axillary adenopathy.     Left upper body: No axillary adenopathy.  Skin:    General: Skin is warm and dry.     Findings: No rash.  Neurological:     Mental Status: She is alert and oriented to person, place, and time.     Cranial Nerves: No cranial nerve deficit.  Psychiatric:        Behavior: Behavior normal.     Results for orders placed or performed in visit on 12/06/19  CBC with Differential/Platelet out  Result Value Ref Range   WBC 5.4 3.4 - 10.8  x10E3/uL   RBC 4.28 3.77 - 5.28 x10E6/uL   Hemoglobin 13.2 11.1 - 15.9 g/dL   Hematocrit 40.9 73.5 - 46.6 %   MCV 90 79 - 97 fL   MCH 30.8 26.6 - 33.0 pg   MCHC 34.3 31.5 - 35.7 g/dL   RDW 32.9 92.4 - 26.8 %   Platelets 235 150 - 450 x10E3/uL   Neutrophils 66 Not Estab. %   Lymphs 23 Not Estab. %   Monocytes 8 Not Estab. %   Eos 2 Not Estab. %   Basos 1 Not Estab. %   Neutrophils Absolute 3.6 1.4 - 7.0 x10E3/uL   Lymphocytes Absolute 1.2 0.7 - 3.1 x10E3/uL   Monocytes Absolute 0.4 0.1 - 0.9 x10E3/uL   EOS (ABSOLUTE) 0.1 0.0 - 0.4 x10E3/uL   Basophils Absolute 0.0 0.0 - 0.2 x10E3/uL   Immature Granulocytes 0 Not Estab. %   Immature Grans (Abs) 0.0 0.0 - 0.1 x10E3/uL  Comprehensive metabolic panel  Result Value Ref Range   Glucose 88 65 - 99 mg/dL   BUN 12 6 - 24 mg/dL   Creatinine, Ser 3.41 0.57 - 1.00 mg/dL   GFR calc non Af Amer 104 >59 mL/min/1.73   GFR calc Af Amer 119 >59 mL/min/1.73   BUN/Creatinine Ratio 18 9 - 23   Sodium 136 134 - 144 mmol/L   Potassium 3.7 3.5 - 5.2 mmol/L   Chloride 98 96 -  106 mmol/L   CO2 25 20 - 29 mmol/L   Calcium 9.1 8.7 - 10.2 mg/dL   Total Protein 7.6 6.0 - 8.5 g/dL   Albumin 4.7 3.8 - 4.8 g/dL   Globulin, Total 2.9 1.5 - 4.5 g/dL   Albumin/Globulin Ratio 1.6 1.2 - 2.2   Bilirubin Total 0.6 0.0 - 1.2 mg/dL   Alkaline Phosphatase 90 39 - 117 IU/L   AST 16 0 - 40 IU/L   ALT 17 0 - 32 IU/L  Lipid Panel w/o Chol/HDL Ratio out  Result Value Ref Range   Cholesterol, Total 195 100 - 199 mg/dL   Triglycerides 92 0 - 149 mg/dL   HDL 47 >63 mg/dL   VLDL Cholesterol Cal 17 5 - 40 mg/dL   LDL Chol Calc (NIH) 016 (H) 0 - 99 mg/dL  TSH  Result Value Ref Range   TSH 1.590 0.450 - 4.500 uIU/mL  UA/M w/rflx Culture, Routine   Specimen: Urine   URINE  Result Value Ref Range   Specific Gravity, UA <1.005 (L) 1.005 - 1.030   pH, UA 5.5 5.0 - 7.5   Color, UA Yellow Yellow   Appearance Ur Clear Clear   Leukocytes,UA Negative Negative    Protein,UA Negative Negative/Trace   Glucose, UA Negative Negative   Ketones, UA Negative Negative   RBC, UA Negative Negative   Bilirubin, UA Negative Negative   Urobilinogen, Ur 0.2 0.2 - 1.0 mg/dL   Nitrite, UA Negative Negative      Assessment & Plan:   Problem List Items Addressed This Visit      Respiratory   Allergic rhinitis    Stable and under good control, continue current regimen        Digestive   GERD (gastroesophageal reflux disease) - Primary    Restart protonix prn, diet modifications reviewed       Other Visit Diagnoses    Annual physical exam       Relevant Orders   CBC with Differential/Platelet out (Completed)   Comprehensive metabolic panel (Completed)   Lipid Panel w/o Chol/HDL Ratio out (Completed)   TSH (Completed)   UA/M w/rflx Culture, Routine (Completed)   Localized swelling on left hand       Resolving spider bite. Ice, NSAIDs prn. Return precautions given       Follow up plan: Return in about 1 year (around 12/05/2020) for CPE.   LABORATORY TESTING:  - Pap smear: not applicable  IMMUNIZATIONS:   - Tdap: Tetanus vaccination status reviewed: last tetanus booster within 10 years. - Influenza: Up to date  SCREENING: -Mammogram: Up to date   PATIENT COUNSELING:   Advised to take 1 mg of folate supplement per day if capable of pregnancy.   Sexuality: Discussed sexually transmitted diseases, partner selection, use of condoms, avoidance of unintended pregnancy  and contraceptive alternatives.   Advised to avoid cigarette smoking.  I discussed with the patient that most people either abstain from alcohol or drink within safe limits (<=14/week and <=4 drinks/occasion for males, <=7/weeks and <= 3 drinks/occasion for females) and that the risk for alcohol disorders and other health effects rises proportionally with the number of drinks per week and how often a drinker exceeds daily limits.  Discussed cessation/primary prevention of drug  use and availability of treatment for abuse.   Diet: Encouraged to adjust caloric intake to maintain  or achieve ideal body weight, to reduce intake of dietary saturated fat and total fat, to limit sodium intake  by avoiding high sodium foods and not adding table salt, and to maintain adequate dietary potassium and calcium preferably from fresh fruits, vegetables, and low-fat dairy products.    stressed the importance of regular exercise  Injury prevention: Discussed safety belts, safety helmets, smoke detector, smoking near bedding or upholstery.   Dental health: Discussed importance of regular tooth brushing, flossing, and dental visits.    NEXT PREVENTATIVE PHYSICAL DUE IN 1 YEAR. Return in about 1 year (around 12/05/2020) for CPE.

## 2019-12-07 LAB — CBC WITH DIFFERENTIAL/PLATELET
Basophils Absolute: 0 10*3/uL (ref 0.0–0.2)
Basos: 1 %
EOS (ABSOLUTE): 0.1 10*3/uL (ref 0.0–0.4)
Eos: 2 %
Hematocrit: 38.5 % (ref 34.0–46.6)
Hemoglobin: 13.2 g/dL (ref 11.1–15.9)
Immature Grans (Abs): 0 10*3/uL (ref 0.0–0.1)
Immature Granulocytes: 0 %
Lymphocytes Absolute: 1.2 10*3/uL (ref 0.7–3.1)
Lymphs: 23 %
MCH: 30.8 pg (ref 26.6–33.0)
MCHC: 34.3 g/dL (ref 31.5–35.7)
MCV: 90 fL (ref 79–97)
Monocytes Absolute: 0.4 10*3/uL (ref 0.1–0.9)
Monocytes: 8 %
Neutrophils Absolute: 3.6 10*3/uL (ref 1.4–7.0)
Neutrophils: 66 %
Platelets: 235 10*3/uL (ref 150–450)
RBC: 4.28 x10E6/uL (ref 3.77–5.28)
RDW: 12.1 % (ref 11.7–15.4)
WBC: 5.4 10*3/uL (ref 3.4–10.8)

## 2019-12-07 LAB — UA/M W/RFLX CULTURE, ROUTINE
Bilirubin, UA: NEGATIVE
Glucose, UA: NEGATIVE
Ketones, UA: NEGATIVE
Leukocytes,UA: NEGATIVE
Nitrite, UA: NEGATIVE
Protein,UA: NEGATIVE
RBC, UA: NEGATIVE
Specific Gravity, UA: <1.005 — ABNORMAL LOW (ref 1.005–1.030)
Urobilinogen, Ur: 0.2 mg/dL (ref 0.2–1.0)
pH, UA: 5.5 (ref 5.0–7.5)

## 2019-12-07 LAB — LIPID PANEL W/O CHOL/HDL RATIO
Cholesterol, Total: 195 mg/dL (ref 100–199)
HDL: 47 mg/dL (ref >39)
LDL Chol Calc (NIH): 131 mg/dL — ABNORMAL HIGH (ref 0–99)
Triglycerides: 92 mg/dL (ref 0–149)
VLDL Cholesterol Cal: 17 mg/dL (ref 5–40)

## 2019-12-07 LAB — COMPREHENSIVE METABOLIC PANEL
ALT: 17 IU/L (ref 0–32)
AST: 16 IU/L (ref 0–40)
Albumin/Globulin Ratio: 1.6 (ref 1.2–2.2)
Albumin: 4.7 g/dL (ref 3.8–4.8)
Alkaline Phosphatase: 90 IU/L (ref 39–117)
BUN/Creatinine Ratio: 18 (ref 9–23)
BUN: 12 mg/dL (ref 6–24)
Bilirubin Total: 0.6 mg/dL (ref 0.0–1.2)
CO2: 25 mmol/L (ref 20–29)
Calcium: 9.1 mg/dL (ref 8.7–10.2)
Chloride: 98 mmol/L (ref 96–106)
Creatinine, Ser: 0.67 mg/dL (ref 0.57–1.00)
GFR calc Af Amer: 119 mL/min/{1.73_m2} (ref >59)
GFR calc non Af Amer: 104 mL/min/{1.73_m2} (ref >59)
Globulin, Total: 2.9 g/dL (ref 1.5–4.5)
Glucose: 88 mg/dL (ref 65–99)
Potassium: 3.7 mmol/L (ref 3.5–5.2)
Sodium: 136 mmol/L (ref 134–144)
Total Protein: 7.6 g/dL (ref 6.0–8.5)

## 2019-12-07 LAB — TSH: TSH: 1.59 u[IU]/mL (ref 0.450–4.500)

## 2019-12-11 NOTE — Assessment & Plan Note (Signed)
Stable and under good control, continue current regimen 

## 2019-12-11 NOTE — Assessment & Plan Note (Signed)
Restart protonix prn, diet modifications reviewed

## 2020-03-03 ENCOUNTER — Other Ambulatory Visit: Payer: Self-pay | Admitting: Family Medicine

## 2020-03-10 ENCOUNTER — Other Ambulatory Visit: Payer: Self-pay | Admitting: Family Medicine

## 2020-07-08 ENCOUNTER — Encounter: Payer: Self-pay | Admitting: Family Medicine

## 2020-07-15 ENCOUNTER — Other Ambulatory Visit: Payer: Self-pay | Admitting: Family Medicine

## 2020-08-11 ENCOUNTER — Encounter: Payer: Self-pay | Admitting: Family Medicine

## 2020-08-12 NOTE — Telephone Encounter (Signed)
Good Morning, Per your provider you would need to have an appointment for this, is there a day or time that works best for you? Do you prefer mornings or afternoons?

## 2020-08-14 ENCOUNTER — Other Ambulatory Visit: Payer: Self-pay | Admitting: Family Medicine

## 2020-08-14 ENCOUNTER — Encounter: Payer: Self-pay | Admitting: Family Medicine

## 2020-08-14 NOTE — Telephone Encounter (Signed)
Requested Prescriptions  Pending Prescriptions Disp Refills  . mometasone (NASONEX) 50 MCG/ACT nasal spray [Pharmacy Med Name: MOMETASONE NASAL SPRAY (120)] 17 g 11    Sig: SHAKE LIQUID AND USE 2 SPRAYS NASALLY DAILY     Ear, Nose, and Throat: Nasal Preparations - Corticosteroids Passed - 08/14/2020  3:12 AM      Passed - Valid encounter within last 12 months    Recent Outpatient Visits          8 months ago Gastroesophageal reflux disease, unspecified whether esophagitis present   [facility] Roosvelt Maser [place], [place]   10 months ago BV (bacterial vaginosis)   [facility], [place], [place]   1 year ago Abdominal pain, unspecified abdominal location   [facility], [place], [place]   1 year ago Epigastric pain   [facility] Roosvelt Maser [place], [place]   2 years ago Urinary tract infection without hematuria, site unspecified   [facility] [place], Redge Gainer, [place]      Future Appointments            In 3 months Johnson, Oralia Rud, DO [facility], [facility]

## 2020-08-17 ENCOUNTER — Other Ambulatory Visit: Payer: Self-pay

## 2020-08-17 ENCOUNTER — Ambulatory Visit
Admission: RE | Admit: 2020-08-17 | Discharge: 2020-08-17 | Disposition: A | Discharge status: Home / Self Care | Payer: BC Managed Care – PPO | Source: Ambulatory Visit | Attending: Emergency Medicine | Admitting: Emergency Medicine

## 2020-08-17 VITALS — BP 146/91 | HR 78 | Temp 98.2°F | Resp 18 | Ht 62.0 in | Wt 175.0 lb

## 2020-08-17 DIAGNOSIS — Z7951 Long term (current) use of inhaled steroids: Secondary | ICD-10-CM | POA: Insufficient documentation

## 2020-08-17 DIAGNOSIS — Z79899 Other long term (current) drug therapy: Secondary | ICD-10-CM | POA: Insufficient documentation

## 2020-08-17 DIAGNOSIS — E669 Obesity, unspecified: Secondary | ICD-10-CM | POA: Diagnosis not present

## 2020-08-17 DIAGNOSIS — J3089 Other allergic rhinitis: Secondary | ICD-10-CM | POA: Diagnosis not present

## 2020-08-17 DIAGNOSIS — Z20822 Contact with and (suspected) exposure to covid-19: Secondary | ICD-10-CM | POA: Insufficient documentation

## 2020-08-17 DIAGNOSIS — Z8349 Family history of other endocrine, nutritional and metabolic diseases: Secondary | ICD-10-CM | POA: Insufficient documentation

## 2020-08-17 DIAGNOSIS — Z6832 Body mass index (BMI) 32.0-32.9, adult: Secondary | ICD-10-CM | POA: Diagnosis not present

## 2020-08-17 DIAGNOSIS — K219 Gastro-esophageal reflux disease without esophagitis: Secondary | ICD-10-CM | POA: Insufficient documentation

## 2020-08-17 DIAGNOSIS — A040 Enteropathogenic Escherichia coli infection: Secondary | ICD-10-CM | POA: Diagnosis not present

## 2020-08-17 DIAGNOSIS — Z9049 Acquired absence of other specified parts of digestive tract: Secondary | ICD-10-CM | POA: Diagnosis not present

## 2020-08-17 DIAGNOSIS — R197 Diarrhea, unspecified: Secondary | ICD-10-CM | POA: Insufficient documentation

## 2020-08-17 NOTE — ED Provider Notes (Signed)
[facility]    CSN: [id number] Arrival date & time: 08/17/20  1055      History   Chief Complaint Chief Complaint  Patient presents with   Diarrhea    HPI Stacy Phelps is a 49 y.o. female.   49 yo female here for evaluation of diarrhea x 1 week.  She reports that initially her stools were frequent and were decreasing with Imodium. No she only has diarrhea stools after she eats.   She has not been on any antibiotics recently or traveled. She has not had a fever. She is vaccinated against COVID- and has not been exposed that she is aware of. 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      Past Medical History:  Diagnosis Date   Allergy    Anxiety    Depression    Gout    Gout    Obesity     Patient Active Problem List   Diagnosis Date Noted   GERD (gastroesophageal reflux disease) 12/06/2019   Gout 10/04/2015   Allergic rhinitis 05/31/2015   Obesity 05/31/2015    Past Surgical History:  Procedure Laterality Date   ABDOMINAL HYSTERECTOMY     CHOLECYSTECTOMY      OB History   No obstetric history on file.      Home Medications    Prior to Admission medications   Medication Sig Start Date End Date Taking? Authorizing Provider  pantoprazole (PROTONIX) 40 MG tablet TAKE 1 TABLET BY MOUTH EVERY DAY 07/15/20  Yes Volney American, PA-C  Probiotic Product (PROBIOTIC PO) Take by mouth daily.   Yes [provider]  Loratadine (CLARITIN PO) Take by mouth daily.    [provider]  mometasone (NASONEX) 50 MCG/ACT nasal spray SHAKE LIQUID AND USE 2 SPRAYS NASALLY DAILY 08/14/20   Park Liter P, DO  Multiple Vitamins-Minerals (PX COMPLETE SENIOR MULTIVITS) TABS Take by mouth. 08/16/20   [provider]  sucralfate (CARAFATE) 1 g tablet TAKE 1 TABLET BY MOUTH FOUR TIMES DAILY(WITH MEALS AND AT BEDTIME) Patient not taking: Reported on 01/16/2019 12/19/18   Volney American, PA-C    Family  History Family History  Problem Relation Age of Onset   Diabetes Mother    Thyroid disease Mother    Hypertension Father    Heart disease Father    Diabetes Maternal Grandmother    Hypertension Paternal Grandmother    Stroke Paternal Grandmother    Hypertension Paternal Grandfather    Cancer Paternal Grandfather        bone   Anxiety disorder Sister    Breast cancer Neg Hx     Social History Social History   Tobacco Use   Smoking status: Never Smoker   Smokeless tobacco: Never Used  Scientific laboratory technician Use: Never used  Substance Use Topics   Alcohol use: No    Alcohol/week: 0.0 standard drinks   Drug use: No     Allergies   Acetaminophen   Review of Systems Review of Systems  Constitutional: Negative for activity change, appetite change, fatigue and fever.  HENT: Positive for sinus pressure. Negative for congestion, postnasal drip and rhinorrhea.   Respiratory: Negative for cough, shortness of breath and wheezing.   Cardiovascular: Negative for chest pain.  Gastrointestinal: Positive for diarrhea. Negative for blood in stool, nausea and vomiting.  Genitourinary: Negative for dysuria, frequency and urgency.  Musculoskeletal: Negative for arthralgias and myalgias.  Skin: Negative.  Neurological: Negative for dizziness and syncope.  Hematological: Negative.   Psychiatric/Behavioral: Negative.      Physical Exam Triage Vital Signs ED Triage Vitals  Enc Vitals Group     BP 08/17/20 1115 (!) 146/91     Pulse Rate 08/17/20 1115 78     Resp 08/17/20 1115 18     Temp 08/17/20 1115 98.2 F (36.8 C)     Temp Source 08/17/20 1115 Oral     SpO2 08/17/20 1115 100 %     Weight 08/17/20 1118 175 lb (79.4 kg)     Height 08/17/20 1118 '5\' 2"'  (1.575 m)     Head Circumference --      Peak Flow --      Pain Score 08/17/20 1118 0     Pain Loc --      Pain Edu? --      Excl. in [place]? --    No data found.  Updated Vital Signs BP (!) 146/91 (BP  Location: Right Arm)    Pulse 78    Temp 98.2 F (36.8 C) (Oral)    Resp 18    Ht '5\' 2"'  (1.575 m)    Wt 175 lb (79.4 kg)    LMP  (LMP Unknown)    SpO2 100%    BMI 32.01 kg/m   Visual Acuity Right Eye Distance:   Left Eye Distance:   Bilateral Distance:    Right Eye Near:   Left Eye Near:    Bilateral Near:     Physical Exam Vitals and nursing note reviewed.  Constitutional:      Appearance: Normal appearance.  HENT:     Head: Normocephalic and atraumatic.     Right Ear: Tympanic membrane, ear canal and external ear normal.     Left Ear: Tympanic membrane, ear canal and external ear normal.     Nose: Nose normal.     Mouth/Throat:     Mouth: Mucous membranes are dry.     Pharynx: Oropharynx is clear.  Eyes:     Extraocular Movements: Extraocular movements intact.     Conjunctiva/sclera: Conjunctivae normal.     Pupils: Pupils are equal, round, and reactive to light.  Cardiovascular:     Rate and Rhythm: Normal rate and regular rhythm.     Pulses: Normal pulses.     Heart sounds: Normal heart sounds.  Pulmonary:     Effort: Pulmonary effort is normal.     Breath sounds: Normal breath sounds.  Abdominal:     General: Abdomen is flat. Bowel sounds are normal. There is no distension.     Palpations: Abdomen is soft. There is no mass.     Tenderness: There is no abdominal tenderness.     Hernia: No hernia is present.  Musculoskeletal:        General: Normal range of motion.     Cervical back: Normal range of motion and neck supple.  Skin:    General: Skin is warm and dry.     Capillary Refill: Capillary refill takes less than 2 seconds.  Neurological:     General: No focal deficit present.     Mental Status: She is alert and oriented to person, place, and time.  Psychiatric:        Mood and Affect: Mood normal.        Behavior: Behavior normal.        Thought Content: Thought content normal.        Judgment: Judgment  normal.      UC Treatments / Results   Labs (all labs ordered are listed, but only abnormal results are displayed) Labs Reviewed  SARS CORONAVIRUS 2 (TAT 6-24 HRS)    EKG   Radiology No results found.  Procedures Procedures (including critical care time)  Medications Ordered in UC Medications - No data to display  Initial Impression / Assessment and Plan / UC Course  I have reviewed the triage vital signs and the nursing notes.  Pertinent labs & imaging results that were available during my care of the patient were reviewed by me and considered in my medical decision making (see chart for details).   Patient is here for evaluation of diarrhea x 1 week. She has not been on antibiotics recently and has not traveled out of the country. She has cramping before her stools but no other abdominal pain. Stools are yellow. 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 She denies foul odor or blood.  She has been vaccinated against COVID and had COVID earlier in the year. She is unaware of any sick or COVID positive contacts.   Will swab for COVID and D/C home with stool PCR test kit for stool analysis. Patient can continue Imodium as needed.   Final Clinical Impressions(s) / UC Diagnoses   Final diagnoses:  Diarrhea, unspecified type     Discharge Instructions     You can use Imodium as needed for diarrhea at home and work.  Continue to hydrate with water, broth, and gatorade as needed.  We will check you for COVID and send you home with a stool collection kit so you can bring a sample back and we can analyze it for any infectious source.  If you are unable to take in enough fluids to replace what you have lost through your gut return for re-evaluation and fluids.     ED Prescriptions    None     PDMP not reviewed this encounter.   Margarette Canada, NP 08/17/20 1202

## 2020-08-17 NOTE — ED Triage Notes (Signed)
Patient in today w/ c/o diarrhea for over a week. Patient states she has tried OTC meds to tx her sx w/ no results. Patient also states some abdominal cramping.

## 2020-08-17 NOTE — Discharge Instructions (Addendum)
You can use Imodium as needed for diarrhea at home and work.  Continue to hydrate with water, broth, and gatorade as needed.  We will check you for COVID and send you home with a stool collection kit so you can bring a sample back and we can analyze it for any infectious source.  If you are unable to take in enough fluids to replace what you have lost through your gut return for re-evaluation and fluids.

## 2020-08-18 LAB — GASTROINTESTINAL PANEL BY PCR, STOOL (REPLACES STOOL CULTURE)

## 2020-08-18 LAB — SARS CORONAVIRUS 2 (TAT 6-24 HRS): SARS Coronavirus 2: NEGATIVE

## 2020-08-20 ENCOUNTER — Telehealth (HOSPITAL_COMMUNITY): Payer: Self-pay | Admitting: Emergency Medicine

## 2020-08-20 ENCOUNTER — Ambulatory Visit: Payer: BC Managed Care – PPO | Admitting: Nurse Practitioner

## 2020-08-20 MED ORDER — CIPROFLOXACIN HCL 500 MG PO TABS: 500.0000 mg | ORAL_TABLET | Freq: Two times a day (BID) | ORAL | 0 refills | Status: AC

## 2020-08-23 DIAGNOSIS — M1990 Unspecified osteoarthritis, unspecified site: Secondary | ICD-10-CM

## 2020-08-23 HISTORY — DX: Unspecified osteoarthritis, unspecified site: M19.90

## 2020-10-24 ENCOUNTER — Telehealth: Payer: Self-pay

## 2020-10-24 DIAGNOSIS — Z1231 Encounter for screening mammogram for malignant neoplasm of breast: Secondary | ICD-10-CM

## 2020-10-24 NOTE — Telephone Encounter (Signed)
Order placed, patient notified.

## 2020-10-24 NOTE — Telephone Encounter (Signed)
Copied from CRM [id number]. Topic: General - Inquiry >> Oct 24, 2020  2:18 PM Stacy Phelps D wrote: Reason for CRM: Pt has an appt in Jan with Dr. Laural Benes but she needs an order to [facility] for mammogram this month.  She states it is time for her mammogram  CB#[id number]

## 2020-11-14 ENCOUNTER — Ambulatory Visit
Admission: RE | Admit: 2020-11-14 | Discharge: 2020-11-14 | Disposition: A | Discharge status: Home / Self Care | Payer: BC Managed Care – PPO | Source: Ambulatory Visit | Attending: Family Medicine | Admitting: Family Medicine

## 2020-11-14 ENCOUNTER — Other Ambulatory Visit: Payer: Self-pay

## 2020-11-14 DIAGNOSIS — Z1231 Encounter for screening mammogram for malignant neoplasm of breast: Secondary | ICD-10-CM | POA: Diagnosis present

## 2020-11-21 ENCOUNTER — Other Ambulatory Visit: Payer: Self-pay | Admitting: Family Medicine

## 2020-11-21 NOTE — Telephone Encounter (Signed)
Requested medication (s) are due for refill today:yes  Requested medication (s) are on the active medication list: yes  Last refill:  07/15/20  #30 3 refills by Roosvelt Maser  Future visit scheduled: yes with Dr Laural Benes  Notes to clinic:  Patient has appointment scheduled with Dr Laural Benes but Dr Laural Benes has not yet seen her. Please review    Requested Prescriptions  Pending Prescriptions Disp Refills   pantoprazole (PROTONIX) 40 MG tablet [Pharmacy Med Name: PANTOPRAZOLE 40MG  TABLETS] 30 tablet 3    Sig: TAKE 1 TABLET BY MOUTH EVERY DAY      Gastroenterology: Proton Pump Inhibitors Passed - 11/21/2020  8:09 AM      Passed - Valid encounter within last 12 months    Recent Outpatient Visits           11 months ago Gastroesophageal reflux disease, unspecified whether esophagitis present   [facility], [place]   1 year ago BV (bacterial vaginosis)   [facility], [place], [place]   1 year ago Abdominal pain, unspecified abdominal location   [facility], [place], [place]   2 years ago Epigastric pain   [facility] [place], [place]   2 years ago Urinary tract infection without hematuria, site unspecified   [facility] [place], [facility], [place]       Future Appointments             In 1 month Johnson, Redge Gainer, DO [facility]

## 2020-12-06 ENCOUNTER — Encounter: Payer: BC Managed Care – PPO | Admitting: Family Medicine

## 2020-12-23 ENCOUNTER — Ambulatory Visit (INDEPENDENT_AMBULATORY_CARE_PROVIDER_SITE_OTHER): Payer: BC Managed Care – PPO | Admitting: Family Medicine

## 2020-12-23 ENCOUNTER — Other Ambulatory Visit: Payer: Self-pay

## 2020-12-23 ENCOUNTER — Encounter: Payer: Self-pay | Admitting: Family Medicine

## 2020-12-23 VITALS — BP 134/74 | HR 78 | Temp 98.7°F | Ht 61.93 in | Wt 172.5 lb

## 2020-12-23 DIAGNOSIS — Z1211 Encounter for screening for malignant neoplasm of colon: Secondary | ICD-10-CM

## 2020-12-23 DIAGNOSIS — Z23 Encounter for immunization: Secondary | ICD-10-CM

## 2020-12-23 DIAGNOSIS — Z8739 Personal history of other diseases of the musculoskeletal system and connective tissue: Secondary | ICD-10-CM | POA: Diagnosis not present

## 2020-12-23 DIAGNOSIS — Z0000 Encounter for general adult medical examination without abnormal findings: Secondary | ICD-10-CM

## 2020-12-23 MED ORDER — PANTOPRAZOLE SODIUM 40 MG PO TBEC: 40.0000 mg | DELAYED_RELEASE_TABLET | Freq: Every day | ORAL | 3 refills | Status: DC

## 2020-12-23 MED ORDER — MOMETASONE FUROATE 50 MCG/ACT NA SUSP: NASAL | 12 refills | Status: DC

## 2020-12-23 NOTE — Progress Notes (Signed)
BP 134/74   Pulse 78   Temp 98.7 F (37.1 C)   Ht 5' 1.93" (1.573 m)   Wt 172 lb 8 oz (78.2 kg)   LMP  (LMP Unknown)   SpO2 100%   BMI 31.62 kg/m    Subjective:    Patient ID: Stacy Phelps, female    DOB: Apr 18, 1971, 50 y.o.   MRN: [id number]  HPI: Stacy Phelps is a 50 y.o. female presenting on 12/23/2020 for comprehensive medical examination. Current medical complaints include:none  Menopausal Symptoms: no  Depression Screen done today and results listed below:  Depression screen [facility] 2/9 12/23/2020 12/06/2019 11/19/2017 02/23/2017 10/06/2016  Decreased Interest 0 0 0 0 0  Down, Depressed, Hopeless 0 0 0 0 0  PHQ - 2 Score 0 0 0 0 0  Altered sleeping - 0 0 - 0  Tired, decreased energy - 0 0 - 0  Change in appetite - 0 0 - 0  Feeling bad or failure about yourself  - 0 0 - 0  Trouble concentrating - 0 0 - 0  Moving slowly or fidgety/restless - 0 0 - 0  Suicidal thoughts - 0 0 - 0  PHQ-9 Score - 0 0 - 0    Past Medical History:  Past Medical History:  Diagnosis Date  . Allergy   . Anxiety   . Arthritis October 2021   2 steroid injections  . Depression   . GERD (gastroesophageal reflux disease) November   Doesn't seem to be related to any specific food  . Gout   . Gout   . Obesity     Surgical History:  Past Surgical History:  Procedure Laterality Date  . ABDOMINAL HYSTERECTOMY    . CHOLECYSTECTOMY      Medications:  No current outpatient medications on file prior to visit.   No current facility-administered medications on file prior to visit.    Allergies:  Allergies  Allergen Reactions  . Acetaminophen Nausea And Vomiting    Social History:  Social History   Socioeconomic History  . Marital status: Married    Spouse name: Not on file  . Number of children: Not on file  . Years of education: Not on file  . Highest education level: Not on file  Occupational History  . Not on file  Tobacco Use  . Smoking status: Never Smoker  . Smokeless  tobacco: Never Used  Vaping Use  . Vaping Use: Never used  Substance and Sexual Activity  . Alcohol use: No    Alcohol/week: 0.0 standard drinks  . Drug use: No  . Sexual activity: Yes    Birth control/protection: Surgical  Other Topics Concern  . Not on file  Social History Narrative  . Not on file   Social Determinants of Health   Financial Resource Strain: Not on file  Food Insecurity: Not on file  Transportation Needs: Not on file  Physical Activity: Not on file  Stress: Not on file  Social Connections: Not on file  Intimate Partner Violence: Not on file   Social History   Tobacco Use  Smoking Status Never Smoker  Smokeless Tobacco Never Used   Social History   Substance and Sexual Activity  Alcohol Use No  . Alcohol/week: 0.0 standard drinks    Family History:  Family History  Problem Relation Age of Onset  . Diabetes Mother   . Thyroid disease Mother   . Hypertension Father   . Heart disease Father   .  Diabetes Father   . Diabetes Maternal Grandmother   . Hypertension Paternal Grandmother   . Stroke Paternal Grandmother   . Hypertension Paternal Grandfather   . Cancer Paternal Grandfather        bone  . Stroke Paternal Grandfather   . Anxiety disorder Sister   . Breast cancer Neg Hx     Past medical history, surgical history, medications, allergies, family history and social history reviewed with patient today and changes made to appropriate areas of the chart.   Review of Systems  Constitutional: Negative.   HENT: Positive for congestion. Negative for ear discharge, ear pain, hearing loss, nosebleeds, sinus pain, sore throat and tinnitus.   Eyes: Negative.   Respiratory: Negative.  Negative for stridor.   Cardiovascular: Negative.   Gastrointestinal: Negative.   Genitourinary: Negative.   Musculoskeletal: Negative.   Skin: Negative.   Neurological: Negative.   Endo/Heme/Allergies: Negative.  Negative for environmental allergies and  polydipsia. Does not bruise/bleed easily.  Psychiatric/Behavioral: Negative.    All other ROS negative except what is listed above and in the HPI.      Objective:    BP 134/74   Pulse 78   Temp 98.7 F (37.1 C)   Ht 5' 1.93" (1.573 m)   Wt 172 lb 8 oz (78.2 kg)   LMP  (LMP Unknown)   SpO2 100%   BMI 31.62 kg/m   Wt Readings from Last 3 Encounters:  12/23/20 172 lb 8 oz (78.2 kg)  08/17/20 175 lb (79.4 kg)  12/06/19 180 lb (81.6 kg)    Physical Exam Vitals and nursing note reviewed. Exam conducted with a chaperone present.  Constitutional:      General: She is not in acute distress.    Appearance: Normal appearance. She is not ill-appearing, toxic-appearing or diaphoretic.  HENT:     Head: Normocephalic and atraumatic.     Right Ear: Tympanic membrane, ear canal and external ear normal. There is no impacted cerumen.     Left Ear: Tympanic membrane, ear canal and external ear normal. There is no impacted cerumen.     Nose: Nose normal. No congestion or rhinorrhea.     Mouth/Throat:     Mouth: Mucous membranes are moist.     Pharynx: Oropharynx is clear. No oropharyngeal exudate or posterior oropharyngeal erythema.  Eyes:     General: No scleral icterus.       Right eye: No discharge.        Left eye: No discharge.     Extraocular Movements: Extraocular movements intact.     Conjunctiva/sclera: Conjunctivae normal.     Pupils: Pupils are equal, round, and reactive to light.  Neck:     Vascular: No carotid bruit.  Cardiovascular:     Rate and Rhythm: Normal rate and regular rhythm.     Pulses: Normal pulses.     Heart sounds: No murmur heard. No friction rub. No gallop.   Pulmonary:     Effort: Pulmonary effort is normal. No respiratory distress.     Breath sounds: Normal breath sounds. No stridor. No wheezing, rhonchi or rales.  Chest:     Chest wall: No tenderness.  Breasts:     Right: Normal. No swelling, bleeding, inverted nipple, mass, nipple discharge, skin  change, tenderness or supraclavicular adenopathy.     Left: Normal. No swelling, bleeding, inverted nipple, mass, nipple discharge, skin change, tenderness or supraclavicular adenopathy.    Abdominal:     General: Abdomen is flat.  Bowel sounds are normal. There is no distension.     Palpations: Abdomen is soft. There is no mass.     Tenderness: There is no abdominal tenderness. There is no right CVA tenderness, left CVA tenderness, guarding or rebound.     Hernia: No hernia is present.  Genitourinary:    Comments: Pelvic exams deferred with shared decision making Musculoskeletal:        General: No swelling, tenderness, deformity or signs of injury.     Cervical back: Normal range of motion and neck supple. No rigidity. No muscular tenderness.     Right lower leg: No edema.     Left lower leg: No edema.  Lymphadenopathy:     Cervical: No cervical adenopathy.     Upper Body:     Right upper body: No supraclavicular adenopathy.     Left upper body: No supraclavicular adenopathy.  Skin:    General: Skin is warm and dry.     Capillary Refill: Capillary refill takes less than 2 seconds.     Coloration: Skin is not jaundiced or pale.     Findings: No bruising, erythema, lesion or rash.  Neurological:     General: No focal deficit present.     Mental Status: She is alert and oriented to person, place, and time. Mental status is at baseline.     Cranial Nerves: No cranial nerve deficit.     Sensory: No sensory deficit.     Motor: No weakness.     Coordination: Coordination normal.     Gait: Gait normal.     Deep Tendon Reflexes: Reflexes normal.  Psychiatric:        Mood and Affect: Mood normal.        Behavior: Behavior normal.        Thought Content: Thought content normal.        Judgment: Judgment normal.     Results for orders placed or performed during the hospital encounter of 08/17/20  SARS CORONAVIRUS 2 (TAT 6-24 HRS) Nasopharyngeal Nasopharyngeal Swab   Specimen:  Nasopharyngeal Swab  Result Value Ref Range   SARS Coronavirus 2 NEGATIVE NEGATIVE  Gastrointestinal Panel by PCR , Stool  Result Value Ref Range   Campylobacter species NOT DETECTED NOT DETECTED   Plesimonas shigelloides NOT DETECTED NOT DETECTED   Salmonella species NOT DETECTED NOT DETECTED   Yersinia enterocolitica NOT DETECTED NOT DETECTED   Vibrio species NOT DETECTED NOT DETECTED   Vibrio cholerae NOT DETECTED NOT DETECTED   Enteroaggregative E coli (EAEC) NOT DETECTED NOT DETECTED   Enteropathogenic E coli (EPEC) DETECTED (A) NOT DETECTED   Enterotoxigenic E coli (ETEC) NOT DETECTED NOT DETECTED   Shiga like toxin producing E coli (STEC) NOT DETECTED NOT DETECTED   Shigella/Enteroinvasive E coli (EIEC) NOT DETECTED NOT DETECTED   Cryptosporidium NOT DETECTED NOT DETECTED   Cyclospora cayetanensis NOT DETECTED NOT DETECTED   Entamoeba histolytica NOT DETECTED NOT DETECTED   Giardia lamblia NOT DETECTED NOT DETECTED   Adenovirus F40/41 NOT DETECTED NOT DETECTED   Astrovirus NOT DETECTED NOT DETECTED   Norovirus GI/GII NOT DETECTED NOT DETECTED   Rotavirus A NOT DETECTED NOT DETECTED   Sapovirus (I, II, IV, and V) NOT DETECTED NOT DETECTED      Assessment & Plan:   Problem List Items Addressed This Visit   None   Visit Diagnoses    Routine general medical examination at a health care facility    -  Primary   Vaccines  up to date. Screening labs checked today. Mammogram up to date. Colonoscopy ordered. Continue diet and exercise. Call with any concerns.    Relevant Orders   CBC with Differential/Platelet   Comprehensive metabolic panel   Lipid Panel w/o Chol/HDL Ratio   Urinalysis, Routine w reflex microscopic   TSH   Uric acid   Hepatitis C Antibody   History of gout       Labs drawn today.    Relevant Orders   Uric acid   Immunization due       Tdap given today.   Relevant Orders   Tdap vaccine greater than or equal to 7yo IM (Completed)   Screening for colon  cancer       Referral to GI made today.   Relevant Orders   Ambulatory referral to Gastroenterology       Follow up plan: Return in about 1 year (around 12/23/2021) for physical.   LABORATORY TESTING:  - Pap smear: not applicable  IMMUNIZATIONS:   - Tdap: Tetanus vaccination status reviewed: Tdap vaccination indicated and given today. - Influenza: Up to date - Pneumovax: Not applicable - COVID: Refused  SCREENING: -Mammogram: Up to date  - Colonoscopy: Ordered today   PATIENT COUNSELING:   Advised to take 1 mg of folate supplement per day if capable of pregnancy.   Sexuality: Discussed sexually transmitted diseases, partner selection, use of condoms, avoidance of unintended pregnancy  and contraceptive alternatives.   Advised to avoid cigarette smoking.  I discussed with the patient that most people either abstain from alcohol or drink within safe limits (<=14/week and <=4 drinks/occasion for males, <=7/weeks and <= 3 drinks/occasion for females) and that the risk for alcohol disorders and other health effects rises proportionally with the number of drinks per week and how often a drinker exceeds daily limits.  Discussed cessation/primary prevention of drug use and availability of treatment for abuse.   Diet: Encouraged to adjust caloric intake to maintain  or achieve ideal body weight, to reduce intake of dietary saturated fat and total fat, to limit sodium intake by avoiding high sodium foods and not adding table salt, and to maintain adequate dietary potassium and calcium preferably from fresh fruits, vegetables, and low-fat dairy products.    stressed the importance of regular exercise  Injury prevention: Discussed safety belts, safety helmets, smoke detector, smoking near bedding or upholstery.   Dental health: Discussed importance of regular tooth brushing, flossing, and dental visits.    NEXT PREVENTATIVE PHYSICAL DUE IN 1 YEAR. Return in about 1 year (around  12/23/2021) for physical.

## 2020-12-23 NOTE — Patient Instructions (Signed)
Health Maintenance, Female Adopting a healthy lifestyle and getting preventive care are important in promoting health and wellness. Ask your health care provider about:  The right schedule for you to have regular tests and exams.  Things you can do on your own to prevent diseases and keep yourself healthy. What should I know about diet, weight, and exercise? Eat a healthy diet  Eat a diet that includes plenty of vegetables, fruits, low-fat dairy products, and lean protein.  Do not eat a lot of foods that are high in solid fats, added sugars, or sodium.   Maintain a healthy weight Body mass index (BMI) is used to identify weight problems. It estimates body fat based on height and weight. Your health care provider can help determine your BMI and help you achieve or maintain a healthy weight. Get regular exercise Get regular exercise. This is one of the most important things you can do for your health. Most adults should:  Exercise for at least 150 minutes each week. The exercise should increase your heart rate and make you sweat (moderate-intensity exercise).  Do strengthening exercises at least twice a week. This is in addition to the moderate-intensity exercise.  Spend less time sitting. Even light physical activity can be beneficial. Watch cholesterol and blood lipids Have your blood tested for lipids and cholesterol at 51 years of age, then have this test every 5 years. Have your cholesterol levels checked more often if:  Your lipid or cholesterol levels are high.  You are older than 51 years of age.  You are at high risk for heart disease. What should I know about cancer screening? Depending on your health history and family history, you may need to have cancer screening at various ages. This may include screening for:  Breast cancer.  Cervical cancer.  Colorectal cancer.  Skin cancer.  Lung cancer. What should I know about heart disease, diabetes, and high blood  pressure? Blood pressure and heart disease  High blood pressure causes heart disease and increases the risk of stroke. This is more likely to develop in people who have high blood pressure readings, are of African descent, or are overweight.  Have your blood pressure checked: ? Every 3-5 years if you are 18-39 years of age. ? Every year if you are 40 years old or older. Diabetes Have regular diabetes screenings. This checks your fasting blood sugar level. Have the screening done:  Once every three years after age 40 if you are at a normal weight and have a low risk for diabetes.  More often and at a younger age if you are overweight or have a high risk for diabetes. What should I know about preventing infection? Hepatitis B If you have a higher risk for hepatitis B, you should be screened for this virus. Talk with your health care provider to find out if you are at risk for hepatitis B infection. Hepatitis C Testing is recommended for:  Everyone born from 1945 through 1965.  Anyone with known risk factors for hepatitis C. Sexually transmitted infections (STIs)  Get screened for STIs, including gonorrhea and chlamydia, if: ? You are sexually active and are younger than 51 years of age. ? You are older than 51 years of age and your health care provider tells you that you are at risk for this type of infection. ? Your sexual activity has changed since you were last screened, and you are at increased risk for chlamydia or gonorrhea. Ask your health care provider   if you are at risk.  Ask your health care provider about whether you are at high risk for HIV. Your health care provider may recommend a prescription medicine to help prevent HIV infection. If you choose to take medicine to prevent HIV, you should first get tested for HIV. You should then be tested every 3 months for as long as you are taking the medicine. Pregnancy  If you are about to stop having your period (premenopausal) and  you may become pregnant, seek counseling before you get pregnant.  Take 400 to 800 micrograms (mcg) of folic acid every day if you become pregnant.  Ask for birth control (contraception) if you want to prevent pregnancy. Osteoporosis and menopause Osteoporosis is a disease in which the bones lose minerals and strength with aging. This can result in bone fractures. If you are 65 years old or older, or if you are at risk for osteoporosis and fractures, ask your health care provider if you should:  Be screened for bone loss.  Take a calcium or vitamin D supplement to lower your risk of fractures.  Be given hormone replacement therapy (HRT) to treat symptoms of menopause. Follow these instructions at home: Lifestyle  Do not use any products that contain nicotine or tobacco, such as cigarettes, e-cigarettes, and chewing tobacco. If you need help quitting, ask your health care provider.  Do not use street drugs.  Do not share needles.  Ask your health care provider for help if you need support or information about quitting drugs. Alcohol use  Do not drink alcohol if: ? Your health care provider tells you not to drink. ? You are pregnant, may be pregnant, or are planning to become pregnant.  If you drink alcohol: ? Limit how much you use to 0-1 drink a day. ? Limit intake if you are breastfeeding.  Be aware of how much alcohol is in your drink. In the [place] one drink equals one 12 oz bottle of beer (355 mL), one 5 oz glass of wine (148 mL), or one 1 oz glass of hard liquor (44 mL). General instructions  Schedule regular health, dental, and eye exams.  Stay current with your vaccines.  Tell your health care provider if: ? You often feel depressed. ? You have ever been abused or do not feel safe at home. Summary  Adopting a healthy lifestyle and getting preventive care are important in promoting health and wellness.  Follow your health care provider's instructions about healthy  diet, exercising, and getting tested or screened for diseases.  Follow your health care provider's instructions on monitoring your cholesterol and blood pressure. This information is not intended to replace advice given to you by your health care provider. Make sure you discuss any questions you have with your health care provider. Document Revised: 11/02/2018 Document Reviewed: 11/02/2018 Elsevier Patient Education  2021 Elsevier Inc.  

## 2020-12-24 ENCOUNTER — Encounter: Payer: Self-pay | Admitting: Family Medicine

## 2020-12-24 LAB — COMPREHENSIVE METABOLIC PANEL
ALT: 57 IU/L — ABNORMAL HIGH (ref 0–32)
AST: 36 IU/L (ref 0–40)
Albumin/Globulin Ratio: 2 (ref 1.2–2.2)
Albumin: 5.2 g/dL — ABNORMAL HIGH (ref 3.8–4.8)
Alkaline Phosphatase: 79 IU/L (ref 44–121)
BUN/Creatinine Ratio: 17 (ref 9–23)
BUN: 12 mg/dL (ref 6–24)
Bilirubin Total: 0.7 mg/dL (ref 0.0–1.2)
CO2: 24 mmol/L (ref 20–29)
Calcium: 9.5 mg/dL (ref 8.7–10.2)
Chloride: 96 mmol/L (ref 96–106)
Creatinine, Ser: 0.7 mg/dL (ref 0.57–1.00)
GFR calc Af Amer: 117 mL/min/{1.73_m2} (ref >59)
GFR calc non Af Amer: 101 mL/min/{1.73_m2} (ref >59)
Globulin, Total: 2.6 g/dL (ref 1.5–4.5)
Glucose: 87 mg/dL (ref 65–99)
Potassium: 3.5 mmol/L (ref 3.5–5.2)
Sodium: 134 mmol/L (ref 134–144)
Total Protein: 7.8 g/dL (ref 6.0–8.5)

## 2020-12-24 LAB — CBC WITH DIFFERENTIAL/PLATELET
Basophils Absolute: 0 10*3/uL (ref 0.0–0.2)
Basos: 1 %
EOS (ABSOLUTE): 0.1 10*3/uL (ref 0.0–0.4)
Eos: 1 %
Hematocrit: 40.6 % (ref 34.0–46.6)
Hemoglobin: 13.8 g/dL (ref 11.1–15.9)
Immature Grans (Abs): 0 10*3/uL (ref 0.0–0.1)
Immature Granulocytes: 0 %
Lymphocytes Absolute: 1.4 10*3/uL (ref 0.7–3.1)
Lymphs: 24 %
MCH: 30.9 pg (ref 26.6–33.0)
MCHC: 34 g/dL (ref 31.5–35.7)
MCV: 91 fL (ref 79–97)
Monocytes Absolute: 0.5 10*3/uL (ref 0.1–0.9)
Monocytes: 8 %
Neutrophils Absolute: 3.9 10*3/uL (ref 1.4–7.0)
Neutrophils: 66 %
Platelets: 253 10*3/uL (ref 150–450)
RBC: 4.47 x10E6/uL (ref 3.77–5.28)
RDW: 11.7 % (ref 11.7–15.4)
WBC: 5.9 10*3/uL (ref 3.4–10.8)

## 2020-12-24 LAB — LIPID PANEL W/O CHOL/HDL RATIO
Cholesterol, Total: 184 mg/dL (ref 100–199)
HDL: 49 mg/dL (ref >39)
LDL Chol Calc (NIH): 121 mg/dL — ABNORMAL HIGH (ref 0–99)
Triglycerides: 73 mg/dL (ref 0–149)
VLDL Cholesterol Cal: 14 mg/dL (ref 5–40)

## 2020-12-24 LAB — HEPATITIS C ANTIBODY: Hep C Virus Ab: <0.1 s/co ratio (ref 0.0–0.9)

## 2020-12-24 LAB — URINALYSIS, ROUTINE W REFLEX MICROSCOPIC
Bilirubin, UA: NEGATIVE
Glucose, UA: NEGATIVE
Leukocytes,UA: NEGATIVE
Nitrite, UA: NEGATIVE
Protein,UA: NEGATIVE
RBC, UA: NEGATIVE
Specific Gravity, UA: <1.005 — ABNORMAL LOW (ref 1.005–1.030)
Urobilinogen, Ur: 0.2 mg/dL (ref 0.2–1.0)
pH, UA: 5.5 (ref 5.0–7.5)

## 2020-12-24 LAB — TSH: TSH: 1.91 u[IU]/mL (ref 0.450–4.500)

## 2020-12-24 LAB — URIC ACID: Uric Acid: 4.7 mg/dL (ref 2.6–6.2)

## 2021-01-01 ENCOUNTER — Other Ambulatory Visit: Payer: Self-pay

## 2021-01-01 ENCOUNTER — Telehealth (INDEPENDENT_AMBULATORY_CARE_PROVIDER_SITE_OTHER): Payer: Self-pay | Admitting: Gastroenterology

## 2021-01-01 DIAGNOSIS — Z1211 Encounter for screening for malignant neoplasm of colon: Secondary | ICD-10-CM

## 2021-01-01 MED ORDER — PEG 3350-KCL-NA BICARB-NACL 420 G PO SOLR: 4000.0000 mL | Freq: Once | ORAL | 0 refills | Status: AC

## 2021-01-01 NOTE — Progress Notes (Signed)
Gastroenterology Pre-Procedure Review  Request Date: 03/11/21 Requesting Physician: Dr. Allegra Lai  PATIENT REVIEW QUESTIONS: The patient responded to the following health history questions as indicated:    1. Are you having any GI issues? no 2. Do you have a personal history of Polyps? no 3. Do you have a family history of Colon Cancer or Polyps?  4. Diabetes Mellitus? no 5. Joint replacements in the past 12 months?no 6. Major health problems in the past 3 months?no 7. Any artificial heart valves, MVP, or defibrillator?no    MEDICATIONS & ALLERGIES:    Patient reports the following regarding taking any anticoagulation/antiplatelet therapy:   Plavix, Coumadin, Eliquis, Xarelto, Lovenox, Pradaxa, Brilinta, or Effient?  Aspirin?   Patient confirms/reports the following medications:  Current Outpatient Medications  Medication Sig Dispense Refill  . mometasone (NASONEX) 50 MCG/ACT nasal spray SHAKE LIQUID AND USE 2 SPRAYS to each nares DAILY 17 g 12  . pantoprazole (PROTONIX) 40 MG tablet Take 1 tablet (40 mg total) by mouth daily. 90 tablet 3  . polyethylene glycol-electrolytes (NULYTELY) 420 g solution Take 4,000 mLs by mouth once for 1 dose. 4000 mL 0   No current facility-administered medications for this visit.    Patient confirms/reports the following allergies:  Allergies  Allergen Reactions  . Acetaminophen Nausea And Vomiting    Orders Placed This Encounter  Procedures  . Procedural/ Surgical Case Request: COLONOSCOPY WITH PROPOFOL    Standing Status:   Standing    Number of Occurrences:   1    Order Specific Question:   Pre-op diagnosis    Answer:   screening colonoscopy    Order Specific Question:   CPT Code    Answer:   29021    AUTHORIZATION INFORMATION Primary Insurance: 1D#: Group #:  Secondary Insurance: 1D#: Group #:  SCHEDULE INFORMATION: Date: 04/19 Time: Location:[facility]

## 2021-03-11 ENCOUNTER — Encounter: Payer: Self-pay | Admitting: Gastroenterology

## 2021-03-11 ENCOUNTER — Ambulatory Visit
Admission: RE | Admit: 2021-03-11 | Discharge: 2021-03-11 | Disposition: A | Discharge status: Home / Self Care | Payer: BC Managed Care – PPO | Attending: Gastroenterology | Admitting: Gastroenterology

## 2021-03-11 ENCOUNTER — Ambulatory Visit: Payer: BC Managed Care – PPO | Admitting: Certified Registered"

## 2021-03-11 ENCOUNTER — Other Ambulatory Visit: Payer: Self-pay

## 2021-03-11 ENCOUNTER — Encounter
Admission: RE | Disposition: A | Discharge status: Home / Self Care | Payer: Self-pay | Source: Home / Self Care | Attending: Gastroenterology

## 2021-03-11 DIAGNOSIS — Z8249 Family history of ischemic heart disease and other diseases of the circulatory system: Secondary | ICD-10-CM | POA: Insufficient documentation

## 2021-03-11 DIAGNOSIS — Z8349 Family history of other endocrine, nutritional and metabolic diseases: Secondary | ICD-10-CM | POA: Insufficient documentation

## 2021-03-11 DIAGNOSIS — K219 Gastro-esophageal reflux disease without esophagitis: Secondary | ICD-10-CM | POA: Insufficient documentation

## 2021-03-11 DIAGNOSIS — Z818 Family history of other mental and behavioral disorders: Secondary | ICD-10-CM | POA: Insufficient documentation

## 2021-03-11 DIAGNOSIS — Z1211 Encounter for screening for malignant neoplasm of colon: Secondary | ICD-10-CM | POA: Diagnosis not present

## 2021-03-11 DIAGNOSIS — Z7951 Long term (current) use of inhaled steroids: Secondary | ICD-10-CM | POA: Insufficient documentation

## 2021-03-11 DIAGNOSIS — Z833 Family history of diabetes mellitus: Secondary | ICD-10-CM | POA: Diagnosis not present

## 2021-03-11 DIAGNOSIS — Z888 Allergy status to other drugs, medicaments and biological substances status: Secondary | ICD-10-CM | POA: Insufficient documentation

## 2021-03-11 DIAGNOSIS — Z823 Family history of stroke: Secondary | ICD-10-CM | POA: Insufficient documentation

## 2021-03-11 DIAGNOSIS — Z808 Family history of malignant neoplasm of other organs or systems: Secondary | ICD-10-CM | POA: Insufficient documentation

## 2021-03-11 HISTORY — PX: COLONOSCOPY WITH PROPOFOL: SHX5780

## 2021-03-11 SURGERY — COLONOSCOPY WITH PROPOFOL
Anesthesia: General

## 2021-03-11 MED ORDER — SODIUM CHLORIDE 0.9 % IV SOLN
INTRAVENOUS | Status: DC
Start: 2021-03-11 — End: 2021-03-11

## 2021-03-11 MED ORDER — PROPOFOL 500 MG/50ML IV EMUL
INTRAVENOUS | Status: DC | PRN
  Administered 2021-03-11: 150 ug/kg/min via INTRAVENOUS

## 2021-03-11 MED ORDER — PROPOFOL 10 MG/ML IV BOLUS
INTRAVENOUS | Status: DC | PRN
  Administered 2021-03-11: 100 mg via INTRAVENOUS

## 2021-03-11 NOTE — H&P (Signed)
Wyline Mood, MD [address], [place], [place], [address] [address], [place], [place], [address] Phone: [phone]  Fax: [phone]  Primary Care Physician:  Dorcas Carrow, DO   Pre-Procedure History & Physical: HPI:  Stacy Phelps is a 50 y.o. female is here for an colonoscopy.   Past Medical History:  Diagnosis Date  . Allergy   . Anxiety   . Arthritis October 2021   2 steroid injections  . Depression   . GERD (gastroesophageal reflux disease) November   Doesn't seem to be related to any specific food  . Gout   . Gout   . Obesity     Past Surgical History:  Procedure Laterality Date  . ABDOMINAL HYSTERECTOMY    . CHOLECYSTECTOMY      Prior to Admission medications   Medication Sig Start Date End Date Taking? Authorizing Provider  mometasone (NASONEX) 50 MCG/ACT nasal spray SHAKE LIQUID AND USE 2 SPRAYS to each nares DAILY 12/23/20  Yes Johnson, Megan P, DO  pantoprazole (PROTONIX) 40 MG tablet Take 1 tablet (40 mg total) by mouth daily. 12/23/20  Yes Johnson, Megan P, DO    Allergies as of 01/01/2021 - Review Complete 12/23/2020  Allergen Reaction Noted  . Acetaminophen Nausea And Vomiting 06/10/2015    Family History  Problem Relation Age of Onset  . Diabetes Mother   . Thyroid disease Mother   . Hypertension Father   . Heart disease Father   . Diabetes Father   . Diabetes Maternal Grandmother   . Hypertension Paternal Grandmother   . Stroke Paternal Grandmother   . Hypertension Paternal Grandfather   . Cancer Paternal Grandfather        bone  . Stroke Paternal Grandfather   . Anxiety disorder Sister   . Breast cancer Neg Hx     Social History   Socioeconomic History  . Marital status: Married    Spouse name: Not on file  . Number of children: Not on file  . Years of education: Not on file  . Highest education level: Not on file  Occupational History  . Not on file  Tobacco Use  . Smoking status: Never  Smoker  . Smokeless tobacco: Never Used  Vaping Use  . Vaping Use: Never used  Substance and Sexual Activity  . Alcohol use: No    Alcohol/week: 0.0 standard drinks  . Drug use: No  . Sexual activity: Yes    Birth control/protection: Surgical  Other Topics Concern  . Not on file  Social History Narrative  . Not on file   Social Determinants of Health   Financial Resource Strain: Not on file  Food Insecurity: Not on file  Transportation Needs: Not on file  Physical Activity: Not on file  Stress: Not on file  Social Connections: Not on file  Intimate Partner Violence: Not on file    Review of Systems: See HPI, otherwise negative ROS  Physical Exam: BP 135/89   Pulse 92   Temp (!) 96.3 F (35.7 C)   Resp 15   Ht 5\' 2"  (1.575 m)   Wt 67.1 kg   LMP  (LMP Unknown)   SpO2 100%   BMI 27.07 kg/m  General:   Alert,  pleasant and cooperative in NAD Head:  Normocephalic and atraumatic. Neck:  Supple; no masses or thyromegaly. Lungs:  Clear throughout to auscultation, normal respiratory effort.    Heart:  +S1, +S2, Regular rate and  rhythm, No edema. Abdomen:  Soft, nontender and nondistended. Normal bowel sounds, without guarding, and without rebound.   Neurologic:  Alert and  oriented x4;  grossly normal neurologically.  Impression/Plan: Stacy Phelps is here for an colonoscopy to be performed for Screening colonoscopy average risk   Risks, benefits, limitations, and alternatives regarding  colonoscopy have been reviewed with the patient.  Questions have been answered.  All parties agreeable.   Wyline Mood, MD  03/11/2021, 9:46 AM

## 2021-03-11 NOTE — Transfer of Care (Signed)
Immediate Anesthesia Transfer of Care Note  Patient: Stacy Phelps  Procedure(s) Performed: COLONOSCOPY WITH PROPOFOL (N/A )  Patient Location: PACU and Endoscopy Unit  Anesthesia Type:General  Level of Consciousness: drowsy  Airway & Oxygen Therapy: Patient Spontanous Breathing  Post-op Assessment: Report given to RN  Post vital signs: stable  Last Vitals:  Vitals Value Taken Time  BP    Temp    Pulse    Resp    SpO2      Last Pain:  Vitals:   03/11/21 0928  PainSc: 0-No pain         Complications: No complications documented.

## 2021-03-11 NOTE — Anesthesia Postprocedure Evaluation (Signed)
Anesthesia Post Note  Patient: Stacy Phelps  Procedure(s) Performed: COLONOSCOPY WITH PROPOFOL (N/A )  Patient location during evaluation: PACU Anesthesia Type: General Level of consciousness: awake and alert Pain management: pain level controlled Vital Signs Assessment: post-procedure vital signs reviewed and stable Respiratory status: spontaneous breathing, nonlabored ventilation and respiratory function stable Cardiovascular status: blood pressure returned to baseline and stable Postop Assessment: no apparent nausea or vomiting Anesthetic complications: no   No complications documented.   Last Vitals:  Vitals:   03/11/21 1024 03/11/21 1041  BP:  107/68  Pulse:    Resp:    Temp: (!) 35.6 C   SpO2:      Last Pain:  Vitals:   03/11/21 1041  PainSc: 0-No pain                 Aurelio Brash Richardo Popoff

## 2021-03-11 NOTE — Anesthesia Preprocedure Evaluation (Signed)
Anesthesia Evaluation  Patient identified by MRN, date of birth, ID band Patient awake    Reviewed: Allergy & Precautions, H&P , NPO status , Patient's Chart, lab work & pertinent test results  History of Anesthesia Complications Negative for: history of anesthetic complications  Airway Mallampati: II  TM Distance: >3 FB Neck ROM: full    Dental  (+) Teeth Intact   Pulmonary neg pulmonary ROS, neg sleep apnea, neg COPD,    breath sounds clear to auscultation       Cardiovascular (-) angina(-) Past MI and (-) Cardiac Stents negative cardio ROS  (-) dysrhythmias  Rhythm:regular Rate:Normal     Neuro/Psych PSYCHIATRIC DISORDERS Anxiety Depression negative neurological ROS     GI/Hepatic Neg liver ROS, GERD  Controlled,  Endo/Other  negative endocrine ROS  Renal/GU negative Renal ROS  negative genitourinary   Musculoskeletal   Abdominal   Peds  Hematology negative hematology ROS (+)   Anesthesia Other Findings Past Medical History: No date: Allergy No date: Anxiety October 2021: Arthritis     Comment:  2 steroid injections No date: Depression November: GERD (gastroesophageal reflux disease)     Comment:  Doesn't seem to be related to any specific food No date: Gout No date: Gout No date: Obesity  Past Surgical History: No date: ABDOMINAL HYSTERECTOMY No date: CHOLECYSTECTOMY  BMI    Body Mass Index: 27.07 kg/m      Reproductive/Obstetrics negative OB ROS                             Anesthesia Physical Anesthesia Plan  ASA: II  Anesthesia Plan: General   Post-op Pain Management:    Induction:   PONV Risk Score and Plan: Propofol infusion and TIVA  Airway Management Planned: Nasal Cannula  Additional Equipment:   Intra-op Plan:   Post-operative Plan:   Informed Consent: I have reviewed the patients History and Physical, chart, labs and discussed the procedure  including the risks, benefits and alternatives for the proposed anesthesia with the patient or authorized representative who has indicated his/her understanding and acceptance.     Dental Advisory Given  Plan Discussed with: Anesthesiologist, CRNA and Surgeon  Anesthesia Plan Comments:         Anesthesia Quick Evaluation

## 2021-03-11 NOTE — Op Note (Signed)
[facility] Gastroenterology Patient Name: Stacy Phelps Procedure Date: 03/11/2021 9:35 AM MRN: [id number] Account #: [id number] Date of Birth: Oct 31, 1971 Admit Type: Outpatient Age: 50 Room: [facility] ENDO ROOM 2 Gender: Female Note Status: Finalized Procedure:             Colonoscopy Indications:           Screening for colorectal malignant neoplasm Providers:             Wyline Mood MD, MD Referring MD:          Larae Grooms (Referring MD) Medicines:             Monitored Anesthesia Care Complications:         No immediate complications. Procedure:             Pre-Anesthesia Assessment:                        - Prior to the procedure, a History and Physical was                         performed, and patient medications, allergies and                         sensitivities were reviewed. The patient's tolerance                         of previous anesthesia was reviewed.                        - The risks and benefits of the procedure and the                         sedation options and risks were discussed with the                         patient. All questions were answered and informed                         consent was obtained.                        - ASA Grade Assessment: II - A patient with mild                         systemic disease.                        After obtaining informed consent, the colonoscope was                         passed under direct vision. Throughout the procedure,                         the patient's blood pressure, pulse, and oxygen                         saturations were monitored continuously. The                         Colonoscope was introduced through the anus and  advanced to the the cecum, identified by the                         appendiceal orifice. The colonoscopy was extremely                         difficult due to a tortuous colon. The patient                         tolerated the procedure  well. The quality of the bowel                         preparation was excellent. Findings:      The entire examined colon appeared normal on direct and retroflexion       views.      A few small-mouthed diverticula were found in the entire colon.      The exam was otherwise without abnormality on direct and retroflexion       views. Impression:            - The entire examined colon is normal on direct and                         retroflexion views.                        - No specimens collected. Recommendation:        - Discharge patient to home (with escort).                        - Resume previous diet.                        - Continue present medications.                        - Repeat colonoscopy in 10 years for screening                         purposes. Procedure Code(s):     --- Professional ---                        [phone], Colonoscopy, flexible; diagnostic, including                         collection of specimen(s) by brushing or washing, when                         performed (separate procedure) Diagnosis Code(s):     --- Professional ---                        Z12.11, Encounter for screening for malignant neoplasm                         of colon CPT copyright 2019 [employer]. All rights reserved. The codes documented in this report are preliminary and upon coder review may  be revised to meet current compliance requirements. Wyline Mood, MD Wyline Mood MD, MD 03/11/2021 10:21:29 AM This report has been signed electronically. Number of  Addenda: 0 Note Initiated On: 03/11/2021 9:35 AM Scope Withdrawal Time: 0 hours 6 minutes 30 seconds  Total Procedure Duration: 0 hours 27 minutes 44 seconds  Estimated Blood Loss:  Estimated blood loss: none. Estimated blood loss: none.      [facility]

## 2021-03-12 ENCOUNTER — Encounter: Payer: Self-pay | Admitting: Gastroenterology

## 2021-04-27 ENCOUNTER — Encounter: Payer: Self-pay | Admitting: Family Medicine

## 2021-05-01 ENCOUNTER — Other Ambulatory Visit: Payer: Self-pay

## 2021-05-01 ENCOUNTER — Encounter: Payer: Self-pay | Admitting: Family Medicine

## 2021-05-01 ENCOUNTER — Ambulatory Visit: Payer: BC Managed Care – PPO | Admitting: Family Medicine

## 2021-05-01 VITALS — BP 125/83 | HR 91 | Temp 97.8°F | Wt 142.0 lb

## 2021-05-01 DIAGNOSIS — R232 Flushing: Secondary | ICD-10-CM

## 2021-05-01 DIAGNOSIS — R42 Dizziness and giddiness: Secondary | ICD-10-CM

## 2021-05-01 LAB — URINALYSIS, ROUTINE W REFLEX MICROSCOPIC
Bilirubin, UA: NEGATIVE
Glucose, UA: NEGATIVE
Ketones, UA: NEGATIVE
Leukocytes,UA: NEGATIVE
Nitrite, UA: NEGATIVE
Protein,UA: NEGATIVE
RBC, UA: NEGATIVE
Specific Gravity, UA: <1.005 — ABNORMAL LOW (ref 1.005–1.030)
Urobilinogen, Ur: 0.2 mg/dL (ref 0.2–1.0)
pH, UA: 5 (ref 5.0–7.5)

## 2021-05-01 LAB — BAYER DCA HB A1C WAIVED: HB A1C (BAYER DCA - WAIVED): 5.5 % (ref <7.0)

## 2021-05-01 NOTE — Progress Notes (Signed)
BP 125/83   Pulse 91   Temp 97.8 F (36.6 C)   Wt 142 lb (64.4 kg)   LMP  (LMP Unknown)   SpO2 99%   BMI 25.97 kg/m    Subjective:    Patient ID: Stacy Phelps, female    DOB: [birth year], 50 y.o.   MRN: [id number]  HPI: Stacy Phelps is a 50 y.o. female  Chief Complaint  Patient presents with   Hot Flashes    Patient states she feels really hot sometimes and feels chills on her body but does not sweat. Patient states she feels lightheaded, not dizzy. Patient states she feels foggy. Patient states she feels irritable.    DIZZINESS Duration: about 8 days Description of symptoms: off balance and not right Duration of episode: 15 minutes or so, 2-3x a day Provoking factors: none- but has been very stressed out Aggravating factors:  stress Triggered by rolling over in bed: no Triggered by bending over: no Aggravated by head movement: no Aggravated by exertion, coughing, loud noises: no Recent head injury: no Recent or current viral symptoms: a little bit of sinus pressure History of vasovagal episodes: no Nausea: no Vomiting: no Tinnitus: no Hearing loss: no Aural fullness: no Headache: yes- chronic Photophobia/phonophobia: no Unsteady gait: no Postural instability: no Diplopia, dysarthria, dysphagia or weakness: no Related to exertion: no Pallor: no Diaphoresis: no Dyspnea: yes- can't get a deep breath, then yawns Chest pain: no  Relevant past medical, surgical, family and social history reviewed and updated as indicated. Interim medical history since our last visit reviewed. Allergies and medications reviewed and updated.  Review of Systems  Constitutional:  Positive for chills and fatigue. Negative for activity change, appetite change, diaphoresis, fever and unexpected weight change.  Respiratory: Negative.    Cardiovascular: Negative.   Gastrointestinal: Negative.   Endocrine: Positive for cold intolerance and heat intolerance. Negative for polydipsia,  polyphagia and polyuria.  Musculoskeletal: Negative.   Skin: Negative.   Neurological:  Positive for light-headedness and headaches. Negative for dizziness, tremors, seizures, syncope, facial asymmetry, speech difficulty, weakness and numbness.  Psychiatric/Behavioral:  Negative for agitation, behavioral problems, confusion, decreased concentration, dysphoric mood, hallucinations, self-injury, sleep disturbance and suicidal ideas. The patient is nervous/anxious. The patient is not hyperactive.    Per HPI unless specifically indicated above     Objective:    BP 125/83   Pulse 91   Temp 97.8 F (36.6 C)   Wt 142 lb (64.4 kg)   LMP  (LMP Unknown)   SpO2 99%   BMI 25.97 kg/m   Wt Readings from Last 3 Encounters:  05/01/21 142 lb (64.4 kg)  03/11/21 148 lb (67.1 kg)  12/23/20 172 lb 8 oz (78.2 kg)   Orthostatic VS for the past 24 hrs:  BP- Lying Pulse- Lying BP- Sitting Pulse- Sitting BP- Standing at 0 minutes Pulse- Standing at 0 minutes  05/01/21 1643 118/77 85 124/85 91 129/85 98     Physical Exam Vitals and nursing note reviewed.  Constitutional:      General: She is not in acute distress.    Appearance: Normal appearance. She is not ill-appearing, toxic-appearing or diaphoretic.  HENT:     Head: Normocephalic and atraumatic.     Right Ear: External ear normal.     Left Ear: External ear normal.     Nose: Nose normal.     Mouth/Throat:     Mouth: Mucous membranes are moist.     Pharynx: Oropharynx is  clear.  Eyes:     General: No scleral icterus.       Right eye: No discharge.        Left eye: No discharge.     Extraocular Movements: Extraocular movements intact.     Conjunctiva/sclera: Conjunctivae normal.     Pupils: Pupils are equal, round, and reactive to light.  Cardiovascular:     Rate and Rhythm: Normal rate and regular rhythm.     Pulses: Normal pulses.     Heart sounds: Normal heart sounds. No murmur heard.   No friction rub. No gallop.  Pulmonary:      Effort: Pulmonary effort is normal. No respiratory distress.     Breath sounds: Normal breath sounds. No stridor. No wheezing, rhonchi or rales.  Chest:     Chest wall: No tenderness.  Musculoskeletal:        General: Normal range of motion.     Cervical back: Normal range of motion and neck supple.  Skin:    General: Skin is warm and dry.     Capillary Refill: Capillary refill takes less than 2 seconds.     Coloration: Skin is not jaundiced or pale.     Findings: No bruising, erythema, lesion or rash.  Neurological:     General: No focal deficit present.     Mental Status: She is alert and oriented to person, place, and time. Mental status is at baseline.  Psychiatric:        Mood and Affect: Mood normal.        Behavior: Behavior normal.        Thought Content: Thought content normal.        Judgment: Judgment normal.    Results for orders placed or performed in visit on 05/01/21  Bayer DCA Hb A1c Waived  Result Value Ref Range   HB A1C (BAYER DCA - WAIVED) 5.5 <7.0 %  Urinalysis, Routine w reflex microscopic  Result Value Ref Range   Specific Gravity, UA <1.005 (L) 1.005 - 1.030   pH, UA 5.0 5.0 - 7.5   Color, UA Yellow Yellow   Appearance Ur Clear Clear   Leukocytes,UA Negative Negative   Protein,UA Negative Negative/Trace   Glucose, UA Negative Negative   Ketones, UA Negative Negative   RBC, UA Negative Negative   Bilirubin, UA Negative Negative   Urobilinogen, Ur 0.2 0.2 - 1.0 mg/dL   Nitrite, UA Negative Negative      Assessment & Plan:   Problem List Items Addressed This Visit   None Visit Diagnoses     Dizziness    -  Primary   EKG and orthostatics normal. Will check labs. Await results. Treat as needed.   Relevant Orders   Bayer DCA Hb A1c Waived (Completed)   CBC with Differential/Platelet   Comprehensive metabolic panel   VITAMIN D 25 Hydroxy (Vit-D Deficiency, Fractures)   LH   FSH   Estradiol   Thyroid Panel With TSH   Lyme Disease Serology  w/Reflex   Rocky mtn spotted fvr abs pnl(IgG+IgM)   Ehrlichia Antibody Panel   Babesia microti Antibody Panel   Urinalysis, Routine w reflex microscopic (Completed)   EKG 12-Lead (Completed)   Hot flashes       Not having sweats- just feeling really hot- will check labs. Await results. Treat as needed.    Relevant Orders   Bayer DCA Hb A1c Waived (Completed)   CBC with Differential/Platelet   Comprehensive metabolic panel   VITAMIN  D 25 Hydroxy (Vit-D Deficiency, Fractures)   LH   FSH   Estradiol   Thyroid Panel With TSH   Lyme Disease Serology w/Reflex   Rocky mtn spotted fvr abs pnl(IgG+IgM)   Ehrlichia Antibody Panel   Babesia microti Antibody Panel   Urinalysis, Routine w reflex microscopic (Completed)        Follow up plan: Return in about 2 weeks (around 05/15/2021).  >30 minutes spent with patient today

## 2021-05-02 ENCOUNTER — Encounter: Payer: Self-pay | Admitting: Family Medicine

## 2021-05-02 NOTE — Progress Notes (Signed)
Interpreted by me on 05/01/21. NSR at 82bpm, no ST segment changes

## 2021-05-06 ENCOUNTER — Encounter: Payer: Self-pay | Admitting: Family Medicine

## 2021-05-14 LAB — CBC WITH DIFFERENTIAL/PLATELET
Basophils Absolute: 0 10*3/uL (ref 0.0–0.2)
Basos: 1 %
EOS (ABSOLUTE): 0.1 10*3/uL (ref 0.0–0.4)
Eos: 2 %
Hematocrit: 38.5 % (ref 34.0–46.6)
Hemoglobin: 13.3 g/dL (ref 11.1–15.9)
Immature Grans (Abs): 0 10*3/uL (ref 0.0–0.1)
Immature Granulocytes: 0 %
Lymphocytes Absolute: 1.2 10*3/uL (ref 0.7–3.1)
Lymphs: 23 %
MCH: 30.5 pg (ref 26.6–33.0)
MCHC: 34.5 g/dL (ref 31.5–35.7)
MCV: 88 fL (ref 79–97)
Monocytes Absolute: 0.4 10*3/uL (ref 0.1–0.9)
Monocytes: 8 %
Neutrophils Absolute: 3.7 10*3/uL (ref 1.4–7.0)
Neutrophils: 66 %
Platelets: 208 10*3/uL (ref 150–450)
RBC: 4.36 x10E6/uL (ref 3.77–5.28)
RDW: 11.4 % — ABNORMAL LOW (ref 11.7–15.4)
WBC: 5.4 10*3/uL (ref 3.4–10.8)

## 2021-05-14 LAB — ESTRADIOL: Estradiol: 5.9 pg/mL

## 2021-05-14 LAB — COMPREHENSIVE METABOLIC PANEL
ALT: 33 IU/L — ABNORMAL HIGH (ref 0–32)
AST: 22 IU/L (ref 0–40)
Albumin/Globulin Ratio: 1.8 (ref 1.2–2.2)
Albumin: 4.9 g/dL (ref 3.8–4.9)
Alkaline Phosphatase: 72 IU/L (ref 44–121)
BUN/Creatinine Ratio: 30 — ABNORMAL HIGH (ref 9–23)
BUN: 22 mg/dL (ref 6–24)
Bilirubin Total: 0.5 mg/dL (ref 0.0–1.2)
CO2: 23 mmol/L (ref 20–29)
Calcium: 9.8 mg/dL (ref 8.7–10.2)
Chloride: 100 mmol/L (ref 96–106)
Creatinine, Ser: 0.74 mg/dL (ref 0.57–1.00)
Globulin, Total: 2.7 g/dL (ref 1.5–4.5)
Glucose: 98 mg/dL (ref 65–99)
Potassium: 3.8 mmol/L (ref 3.5–5.2)
Sodium: 138 mmol/L (ref 134–144)
Total Protein: 7.6 g/dL (ref 6.0–8.5)
eGFR: 98 mL/min/{1.73_m2} (ref >59)

## 2021-05-14 LAB — THYROID PANEL WITH TSH
Free Thyroxine Index: 1.8 (ref 1.2–4.9)
T3 Uptake Ratio: 24 % (ref 24–39)
T4, Total: 7.4 ug/dL (ref 4.5–12.0)
TSH: 1.98 u[IU]/mL (ref 0.450–4.500)

## 2021-05-14 LAB — BABESIA MICROTI ANTIBODY PANEL
Babesia microti IgG: <1:10 {titer}
Babesia microti IgM: <1:10 {titer}

## 2021-05-14 LAB — EHRLICHIA ANTIBODY PANEL
E. Chaffeensis (HME) IgM Titer: NEGATIVE
E.Chaffeensis (HME) IgG: NEGATIVE
HGE IgG Titer: NEGATIVE
HGE IgM Titer: NEGATIVE

## 2021-05-14 LAB — ROCKY MTN SPOTTED FVR ABS PNL(IGG+IGM)
RMSF IgG: NEGATIVE
RMSF IgM: 0.38 index (ref 0.00–0.89)

## 2021-05-14 LAB — FOLLICLE STIMULATING HORMONE: FSH: 90.9 m[IU]/mL

## 2021-05-14 LAB — VITAMIN D 25 HYDROXY (VIT D DEFICIENCY, FRACTURES): Vit D, 25-Hydroxy: 50.2 ng/mL (ref 30.0–100.0)

## 2021-05-14 LAB — LUTEINIZING HORMONE: LH: 51 m[IU]/mL

## 2021-05-21 ENCOUNTER — Ambulatory Visit: Payer: BC Managed Care – PPO | Admitting: Family Medicine

## 2021-10-08 ENCOUNTER — Other Ambulatory Visit: Payer: Self-pay | Admitting: Family Medicine

## 2021-10-08 ENCOUNTER — Other Ambulatory Visit: Payer: Self-pay | Admitting: Nurse Practitioner

## 2021-10-08 DIAGNOSIS — Z1231 Encounter for screening mammogram for malignant neoplasm of breast: Secondary | ICD-10-CM

## 2021-11-18 ENCOUNTER — Other Ambulatory Visit: Payer: Self-pay

## 2021-11-18 ENCOUNTER — Ambulatory Visit
Admission: RE | Admit: 2021-11-18 | Discharge: 2021-11-18 | Disposition: A | Discharge status: Home / Self Care | Payer: BC Managed Care – PPO | Source: Ambulatory Visit | Attending: Family Medicine | Admitting: Family Medicine

## 2021-11-18 DIAGNOSIS — Z1231 Encounter for screening mammogram for malignant neoplasm of breast: Secondary | ICD-10-CM | POA: Insufficient documentation

## 2021-12-10 ENCOUNTER — Other Ambulatory Visit: Payer: Self-pay | Admitting: Family Medicine

## 2021-12-10 NOTE — Telephone Encounter (Signed)
Requested Prescriptions  Pending Prescriptions Disp Refills   pantoprazole (PROTONIX) 40 MG tablet [Pharmacy Med Name: PANTOPRAZOLE 40MG  TABLETS] 90 tablet 3    Sig: TAKE 1 TABLET(40 MG) BY MOUTH DAILY     Gastroenterology: Proton Pump Inhibitors Passed - 12/10/2021  9:23 PM      Passed - Valid encounter within last 12 months    Recent Outpatient Visits          7 months ago Dizziness   [facility] [place], Megan P, DO   11 months ago Routine general medical examination at a health care facility   [facility] P, DO   2 years ago Gastroesophageal reflux disease, unspecified whether esophagitis present   [facility] [place], [place]   2 years ago BV (bacterial vaginosis)   [facility] [place], [place]   2 years ago Abdominal pain, unspecified abdominal location   [facility], [facility], Salley Hews      Future Appointments            In 2 weeks [place], Laural Benes, DO Oralia Rud, [facility]

## 2021-12-26 ENCOUNTER — Ambulatory Visit (INDEPENDENT_AMBULATORY_CARE_PROVIDER_SITE_OTHER): Payer: BC Managed Care – PPO | Admitting: Family Medicine

## 2021-12-26 ENCOUNTER — Other Ambulatory Visit: Payer: Self-pay

## 2021-12-26 ENCOUNTER — Encounter: Payer: Self-pay | Admitting: Family Medicine

## 2021-12-26 VITALS — BP 125/83 | HR 58 | Temp 97.5°F | Ht 61.5 in | Wt 142.0 lb

## 2021-12-26 DIAGNOSIS — Z0000 Encounter for general adult medical examination without abnormal findings: Secondary | ICD-10-CM | POA: Diagnosis not present

## 2021-12-26 DIAGNOSIS — Z23 Encounter for immunization: Secondary | ICD-10-CM | POA: Diagnosis not present

## 2021-12-26 DIAGNOSIS — M109 Gout, unspecified: Secondary | ICD-10-CM

## 2021-12-26 DIAGNOSIS — Z1231 Encounter for screening mammogram for malignant neoplasm of breast: Secondary | ICD-10-CM

## 2021-12-26 LAB — URINALYSIS, ROUTINE W REFLEX MICROSCOPIC
Bilirubin, UA: NEGATIVE
Glucose, UA: NEGATIVE
Ketones, UA: NEGATIVE
Leukocytes,UA: NEGATIVE
Nitrite, UA: NEGATIVE
Protein,UA: NEGATIVE
RBC, UA: NEGATIVE
Specific Gravity, UA: 1.01 (ref 1.005–1.030)
Urobilinogen, Ur: 0.2 mg/dL (ref 0.2–1.0)
pH, UA: 6 (ref 5.0–7.5)

## 2021-12-26 NOTE — Assessment & Plan Note (Signed)
Checking labs. Not on meds. Await results.

## 2021-12-26 NOTE — Progress Notes (Signed)
BP 125/83    Pulse (!) 58    Temp (!) 97.5 F (36.4 C)    Ht 5' 1.5" (1.562 m)    Wt 142 lb (64.4 kg)    LMP  (LMP Unknown)    SpO2 99%    BMI 26.40 kg/m    Subjective:    Patient ID: Stacy Phelps, female    DOB: 11/05/1971, 51 y.o.   MRN: [id number]  HPI: Stacy Amourammy R Stavely is a 51 y.o. female presenting on 12/26/2021 for comprehensive medical examination. Current medical complaints include: none  Menopausal Symptoms: yes- hot flashes- only   Depression Screen done today and results listed below:  [facility] 12/26/2021 12/23/2020 12/06/2019 11/19/2017 02/23/2017  Decreased Interest 0 0 0 0 0  Down, Depressed, Hopeless 0 0 0 0 0  PHQ - 2 Score 0 0 0 0 0  Altered sleeping 0 - 0 0 -  Tired, decreased energy 0 - 0 0 -  Change in appetite 0 - 0 0 -  Feeling bad or failure about yourself  0 - 0 0 -  Trouble concentrating 0 - 0 0 -  Moving slowly or fidgety/restless 0 - 0 0 -  Suicidal thoughts 0 - 0 0 -  PHQ-9 Score 0 - 0 0 -   Past Medical History:  Past Medical History:  Diagnosis Date   Allergy    Anxiety    Arthritis October 2021   2 steroid injections   Depression    GERD (gastroesophageal reflux disease) November   Doesnt seem to be related to any specific food   Gout    Gout    Obesity     Surgical History:  Past Surgical History:  Procedure Laterality Date   CHOLECYSTECTOMY     COLONOSCOPY WITH PROPOFOL N/A 03/11/2021   Procedure: COLONOSCOPY WITH PROPOFOL;  Surgeon: Wyline MoodAnna, Kiran, MD;  [facility];  Service: Gastroenterology;  Laterality: N/A;   TOTAL ABDOMINAL HYSTERECTOMY      Medications:  Current Outpatient Medications on File Prior to Visit  Medication Sig   mometasone (NASONEX) 50 MCG/ACT nasal spray SHAKE LIQUID AND USE 2 SPRAYS to each nares DAILY   pantoprazole (PROTONIX) 40 MG tablet TAKE 1 TABLET(40 MG) BY MOUTH DAILY   No current facility-administered medications on file prior to visit.    Allergies:  Allergies  Allergen  Reactions   Acetaminophen Nausea And Vomiting    Social History:  Social History   Socioeconomic History   Marital status: Married    Spouse name: Not on file   Number of children: Not on file   Years of education: Not on file   Highest education level: Not on file  Occupational History   Not on file  Tobacco Use   Smoking status: Never   Smokeless tobacco: Never  Vaping Use   Vaping Use: Never used  Substance and Sexual Activity   Alcohol use: No    Alcohol/week: 0.0 standard drinks   Drug use: No   Sexual activity: Yes    Birth control/protection: Surgical  Other Topics Concern   Not on file  Social History Narrative   Not on file   Social Determinants of Health   Financial Resource Strain: Not on file  Food Insecurity: Not on file  Transportation Needs: Not on file  Physical Activity: Not on file  Stress: Not on file  Social Connections: Not on file  Intimate Partner Violence: Not on file   Social  History   Tobacco Use  Smoking Status Never  Smokeless Tobacco Never   Social History   Substance and Sexual Activity  Alcohol Use No   Alcohol/week: 0.0 standard drinks    Family History:  Family History  Problem Relation Age of Onset   Diabetes Mother    Thyroid disease Mother    Hypertension Father    Heart disease Father    Diabetes Father    Diabetes Maternal Grandmother    Hypertension Paternal Grandmother    Stroke Paternal Grandmother    Hypertension Paternal Grandfather    Cancer Paternal Grandfather        bone   Stroke Paternal Grandfather    Anxiety disorder Sister    Breast cancer Neg Hx     Past medical history, surgical history, medications, allergies, family history and social history reviewed with patient today and changes made to appropriate areas of the chart.   Review of Systems  Constitutional:  Positive for diaphoresis. Negative for chills, fever, malaise/fatigue and weight loss.  HENT: Negative.    Eyes: Negative.    Respiratory: Negative.    Cardiovascular: Negative.   Gastrointestinal: Negative.   Genitourinary: Negative.   Musculoskeletal: Negative.   Skin: Negative.   Neurological:  Positive for headaches. Negative for dizziness, tingling, tremors, sensory change, speech change, focal weakness, seizures, loss of consciousness and weakness.  Endo/Heme/Allergies:  Positive for environmental allergies. Negative for polydipsia. Does not bruise/bleed easily.  Psychiatric/Behavioral: Negative.    All other ROS negative except what is listed above and in the HPI.      Objective:    BP 125/83    Pulse (!) 58    Temp (!) 97.5 F (36.4 C)    Ht 5' 1.5" (1.562 m)    Wt 142 lb (64.4 kg)    LMP  (LMP Unknown)    SpO2 99%    BMI 26.40 kg/m   Wt Readings from Last 3 Encounters:  12/26/21 142 lb (64.4 kg)  05/01/21 142 lb (64.4 kg)  03/11/21 148 lb (67.1 kg)    Physical Exam Vitals and nursing note reviewed.  Constitutional:      General: She is not in acute distress.    Appearance: Normal appearance. She is not ill-appearing, toxic-appearing or diaphoretic.  HENT:     Head: Normocephalic and atraumatic.     Right Ear: Tympanic membrane, ear canal and external ear normal. There is no impacted cerumen.     Left Ear: Tympanic membrane, ear canal and external ear normal. There is no impacted cerumen.     Nose: Nose normal. No congestion or rhinorrhea.     Mouth/Throat:     Mouth: Mucous membranes are moist.     Pharynx: Oropharynx is clear. No oropharyngeal exudate or posterior oropharyngeal erythema.  Eyes:     General: No scleral icterus.       Right eye: No discharge.        Left eye: No discharge.     Extraocular Movements: Extraocular movements intact.     Conjunctiva/sclera: Conjunctivae normal.     Pupils: Pupils are equal, round, and reactive to light.  Neck:     Vascular: No carotid bruit.  Cardiovascular:     Rate and Rhythm: Normal rate and regular rhythm.     Pulses: Normal pulses.      Heart sounds: No murmur heard.   No friction rub. No gallop.  Pulmonary:     Effort: Pulmonary effort is normal. No respiratory  distress.     Breath sounds: Normal breath sounds. No stridor. No wheezing, rhonchi or rales.  Chest:     Chest wall: No tenderness.  Breasts:    Right: Normal. No swelling, bleeding, inverted nipple, mass, nipple discharge, skin change or tenderness.     Left: Normal. No swelling, bleeding, inverted nipple, mass, nipple discharge, skin change or tenderness.  Abdominal:     General: Abdomen is flat. Bowel sounds are normal. There is no distension.     Palpations: Abdomen is soft. There is no mass.     Tenderness: There is no abdominal tenderness. There is no right CVA tenderness, left CVA tenderness, guarding or rebound.     Hernia: No hernia is present.  Genitourinary:    Comments: Pelvic exams deferred with shared decision making Musculoskeletal:        General: No swelling, tenderness, deformity or signs of injury.     Cervical back: Normal range of motion and neck supple. No rigidity. No muscular tenderness.     Right lower leg: No edema.     Left lower leg: No edema.  Lymphadenopathy:     Cervical: No cervical adenopathy.     Upper Body:     Right upper body: No supraclavicular, axillary or pectoral adenopathy.     Left upper body: No supraclavicular, axillary or pectoral adenopathy.  Skin:    General: Skin is warm and dry.     Capillary Refill: Capillary refill takes less than 2 seconds.     Coloration: Skin is not jaundiced or pale.     Findings: No bruising, erythema, lesion or rash.  Neurological:     General: No focal deficit present.     Mental Status: She is alert and oriented to person, place, and time. Mental status is at baseline.     Cranial Nerves: No cranial nerve deficit.     Sensory: No sensory deficit.     Motor: No weakness.     Coordination: Coordination normal.     Gait: Gait normal.     Deep Tendon Reflexes: Reflexes normal.   Psychiatric:        Mood and Affect: Mood normal.        Behavior: Behavior normal.        Thought Content: Thought content normal.        Judgment: Judgment normal.    Results for orders placed or performed in visit on 12/26/21  Urinalysis, Routine w reflex microscopic  Result Value Ref Range   Specific Gravity, UA 1.010 1.005 - 1.030   pH, UA 6.0 5.0 - 7.5   Color, UA Yellow Yellow   Appearance Ur Clear Clear   Leukocytes,UA Negative Negative   Protein,UA Negative Negative/Trace   Glucose, UA Negative Negative   Ketones, UA Negative Negative   RBC, UA Negative Negative   Bilirubin, UA Negative Negative   Urobilinogen, Ur 0.2 0.2 - 1.0 mg/dL   Nitrite, UA Negative Negative      Assessment & Plan:   Problem List Items Addressed This Visit       Other   Gout    Checking labs. Not on meds. Await results.       Relevant Orders   Uric acid   Other Visit Diagnoses     Routine general medical examination at a health care facility    -  Primary   Vaccines up to date. Screening labs checked today. Pap N/A. Colonoscopy and mammogram up to date. Continue diet  and exercise. Call with any concerns.    Relevant Orders   CBC with Differential/Platelet   Comprehensive metabolic panel   Lipid Panel w/o Chol/HDL Ratio   Urinalysis, Routine w reflex microscopic (Completed)   TSH   Uric acid   Encounter for screening mammogram for malignant neoplasm of breast       Due in December. Ordered today.   Relevant Orders   MM DIAG BREAST TOMO BILATERAL        Follow up plan: Return in about 1 year (around 12/26/2022) for physical.   LABORATORY TESTING:  - Pap smear: not applicable  IMMUNIZATIONS:   - Tdap: Tetanus vaccination status reviewed: last tetanus booster within 10 years. - Influenza: Up to date - Pneumovax: Not applicable - Prevnar: Not applicable - COVID: Up to date - HPV: Not applicable - Shingrix vaccine: Administered today  SCREENING: -Mammogram: Up to date   - Colonoscopy: Up to date  - Bone Density: Not applicable   PATIENT COUNSELING:   Advised to take 1 mg of folate supplement per day if capable of pregnancy.   Sexuality: Discussed sexually transmitted diseases, partner selection, use of condoms, avoidance of unintended pregnancy  and contraceptive alternatives.   Advised to avoid cigarette smoking.  I discussed with the patient that most people either abstain from alcohol or drink within safe limits (<=14/week and <=4 drinks/occasion for males, <=7/weeks and <= 3 drinks/occasion for females) and that the risk for alcohol disorders and other health effects rises proportionally with the number of drinks per week and how often a drinker exceeds daily limits.  Discussed cessation/primary prevention of drug use and availability of treatment for abuse.   Diet: Encouraged to adjust caloric intake to maintain  or achieve ideal body weight, to reduce intake of dietary saturated fat and total fat, to limit sodium intake by avoiding high sodium foods and not adding table salt, and to maintain adequate dietary potassium and calcium preferably from fresh fruits, vegetables, and low-fat dairy products.    stressed the importance of regular exercise  Injury prevention: Discussed safety belts, safety helmets, smoke detector, smoking near bedding or upholstery.   Dental health: Discussed importance of regular tooth brushing, flossing, and dental visits.    NEXT PREVENTATIVE PHYSICAL DUE IN 1 YEAR. Return in about 1 year (around 12/26/2022) for physical.

## 2021-12-27 LAB — CBC WITH DIFFERENTIAL/PLATELET
Basophils Absolute: 0 10*3/uL (ref 0.0–0.2)
Basos: 1 %
EOS (ABSOLUTE): 0.1 10*3/uL (ref 0.0–0.4)
Eos: 2 %
Hematocrit: 38 % (ref 34.0–46.6)
Hemoglobin: 13 g/dL (ref 11.1–15.9)
Immature Grans (Abs): 0 10*3/uL (ref 0.0–0.1)
Immature Granulocytes: 0 %
Lymphocytes Absolute: 1.3 10*3/uL (ref 0.7–3.1)
Lymphs: 25 %
MCH: 30.7 pg (ref 26.6–33.0)
MCHC: 34.2 g/dL (ref 31.5–35.7)
MCV: 90 fL (ref 79–97)
Monocytes Absolute: 0.5 10*3/uL (ref 0.1–0.9)
Monocytes: 9 %
Neutrophils Absolute: 3.2 10*3/uL (ref 1.4–7.0)
Neutrophils: 63 %
Platelets: 200 10*3/uL (ref 150–450)
RBC: 4.23 x10E6/uL (ref 3.77–5.28)
RDW: 11.7 % (ref 11.7–15.4)
WBC: 5 10*3/uL (ref 3.4–10.8)

## 2021-12-27 LAB — COMPREHENSIVE METABOLIC PANEL
ALT: 28 IU/L (ref 0–32)
AST: 24 IU/L (ref 0–40)
Albumin/Globulin Ratio: 1.7 (ref 1.2–2.2)
Albumin: 4.7 g/dL (ref 3.8–4.9)
Alkaline Phosphatase: 66 IU/L (ref 44–121)
BUN/Creatinine Ratio: 25 — ABNORMAL HIGH (ref 9–23)
BUN: 16 mg/dL (ref 6–24)
Bilirubin Total: 0.5 mg/dL (ref 0.0–1.2)
CO2: 24 mmol/L (ref 20–29)
Calcium: 9.2 mg/dL (ref 8.7–10.2)
Chloride: 96 mmol/L (ref 96–106)
Creatinine, Ser: 0.64 mg/dL (ref 0.57–1.00)
Globulin, Total: 2.8 g/dL (ref 1.5–4.5)
Glucose: 79 mg/dL (ref 70–99)
Potassium: 3.7 mmol/L (ref 3.5–5.2)
Sodium: 137 mmol/L (ref 134–144)
Total Protein: 7.5 g/dL (ref 6.0–8.5)
eGFR: 107 mL/min/{1.73_m2} (ref >59)

## 2021-12-27 LAB — URIC ACID: Uric Acid: 4.8 mg/dL (ref 3.0–7.2)

## 2021-12-27 LAB — LIPID PANEL W/O CHOL/HDL RATIO
Cholesterol, Total: 184 mg/dL (ref 100–199)
HDL: 62 mg/dL (ref >39)
LDL Chol Calc (NIH): 112 mg/dL — ABNORMAL HIGH (ref 0–99)
Triglycerides: 54 mg/dL (ref 0–149)
VLDL Cholesterol Cal: 10 mg/dL (ref 5–40)

## 2021-12-27 LAB — TSH: TSH: 2.42 u[IU]/mL (ref 0.450–4.500)

## 2022-01-05 ENCOUNTER — Encounter: Payer: Self-pay | Admitting: Family Medicine

## 2022-01-05 DIAGNOSIS — R52 Pain, unspecified: Secondary | ICD-10-CM

## 2022-02-14 ENCOUNTER — Other Ambulatory Visit: Payer: Self-pay | Admitting: Family Medicine

## 2022-02-16 NOTE — Telephone Encounter (Signed)
Requested medications are due for refill today.  yes ? ?Requested medications are on the active medications list.  yes ? ?Last refill. 12/23/2020 12 refills ? ?Future visit scheduled.   yes ? ?Notes to clinic.  Medication not delegated. ? ? ? ?Requested Prescriptions  ?Pending Prescriptions Disp Refills  ? mometasone (NASONEX) 50 MCG/ACT nasal spray [Pharmacy Med Name: MOMETASONE NASAL SPRAY (120)] 17 g 12  ?  Sig: SHAKE LIQUID AND USE 2 SPRAYS INTO EACH NOSTRIL DAILY  ?  ? Not Delegated - Ear, Nose, and Throat: Nasal Preparations - Corticosteroids Failed - 02/14/2022  3:13 AM  ?  ?  Failed - This refill cannot be delegated  ?  ?  Passed - Valid encounter within last 12 months  ?  Recent Outpatient Visits   ? ?      ? 1 month ago Routine general medical examination at a health care facility  ? [facility] [place], [place] P, DO  ? 9 months ago Dizziness  ? [facility] [place], [place] P, DO  ? 1 year ago Routine general medical examination at a health care facility  ? [facility] [place], [place] P, DO  ? 2 years ago Gastroesophageal reflux disease, unspecified whether esophagitis present  ? [facility] [place], [place], [place]  ? 2 years ago BV (bacterial vaginosis)  ? [facility] [place] [place], [place], [place]  ? ?  ?  ?Future Appointments   ? ?        ? In 10 months Dorcas Carrow, DO [facility], [facility]  ? ?  ? ?  ?  ?  ?  ?

## 2022-02-19 ENCOUNTER — Encounter (INDEPENDENT_AMBULATORY_CARE_PROVIDER_SITE_OTHER): Payer: Self-pay | Admitting: Vascular Surgery

## 2022-03-14 ENCOUNTER — Other Ambulatory Visit: Payer: Self-pay | Admitting: Family Medicine

## 2022-03-16 NOTE — Telephone Encounter (Signed)
Requested Prescriptions  ?Pending Prescriptions Disp Refills  ?? pantoprazole (PROTONIX) 40 MG tablet [Pharmacy Med Name: PANTOPRAZOLE 40MG  TABLETS] 90 tablet 0  ?  Sig: TAKE 1 TABLET(40 MG) BY MOUTH DAILY  ?  ? Gastroenterology: Proton Pump Inhibitors Passed - 03/14/2022  1:44 PM  ?  ?  Passed - Valid encounter within last 12 months  ?  Recent Outpatient Visits   ?      ? 2 months ago Routine general medical examination at a health care facility  ? [place], Megan P, DO  ? 10 months ago Dizziness  ? [place], DO  ? 1 year ago Routine general medical examination at a health care facility  ? [place], [place] P, DO  ? 2 years ago Gastroesophageal reflux disease, unspecified whether esophagitis present  ? [place], [place], [place]  ? 2 years ago BV (bacterial vaginosis)  ? [place], [place], [place]  ?  ?  ?Future Appointments   ?        ? In 9 months Valerie Roys, DO [facility], [facility]  ?  ? ?  ?  ?  ? ?

## 2022-05-04 ENCOUNTER — Encounter (INDEPENDENT_AMBULATORY_CARE_PROVIDER_SITE_OTHER): Payer: Self-pay | Admitting: Vascular Surgery

## 2022-05-08 ENCOUNTER — Ambulatory Visit: Payer: BC Managed Care – PPO

## 2022-05-15 ENCOUNTER — Ambulatory Visit (INDEPENDENT_AMBULATORY_CARE_PROVIDER_SITE_OTHER): Payer: BC Managed Care – PPO

## 2022-05-15 DIAGNOSIS — Z23 Encounter for immunization: Secondary | ICD-10-CM

## 2022-06-19 ENCOUNTER — Other Ambulatory Visit: Payer: Self-pay | Admitting: Family Medicine

## 2022-06-19 NOTE — Telephone Encounter (Signed)
Requested Prescriptions  Pending Prescriptions Disp Refills  . pantoprazole (PROTONIX) 40 MG tablet [Pharmacy Med Name: PANTOPRAZOLE 40MG  TABLETS] 90 tablet 1    Sig: TAKE 1 TABLET(40 MG) BY MOUTH DAILY     Gastroenterology: Proton Pump Inhibitors Passed - 06/19/2022 11:14 AM      Passed - Valid encounter within last 12 months    Recent Outpatient Visits          5 months ago Routine general medical examination at a health care facility   [facility], [facility] P, DO   1 year ago Dizziness   [facility], DO   1 year ago Routine general medical examination at a health care facility   [facility], [facility] P, DO   2 years ago Gastroesophageal reflux disease, unspecified whether esophagitis present   [facility], Particia Nearing   2 years ago BV (bacterial vaginosis)   [facility], [facility], Salley Hews      Future Appointments            In 6 months [place], Laural Benes, DO [facility], [facility]

## 2022-06-30 ENCOUNTER — Other Ambulatory Visit (INDEPENDENT_AMBULATORY_CARE_PROVIDER_SITE_OTHER): Payer: Self-pay | Admitting: Nurse Practitioner

## 2022-06-30 DIAGNOSIS — R0989 Other specified symptoms and signs involving the circulatory and respiratory systems: Secondary | ICD-10-CM

## 2022-07-01 ENCOUNTER — Encounter (INDEPENDENT_AMBULATORY_CARE_PROVIDER_SITE_OTHER): Payer: Self-pay | Admitting: Nurse Practitioner

## 2022-07-01 ENCOUNTER — Ambulatory Visit (INDEPENDENT_AMBULATORY_CARE_PROVIDER_SITE_OTHER): Payer: BC Managed Care – PPO

## 2022-07-01 ENCOUNTER — Ambulatory Visit (INDEPENDENT_AMBULATORY_CARE_PROVIDER_SITE_OTHER): Payer: BC Managed Care – PPO | Admitting: Nurse Practitioner

## 2022-07-01 VITALS — BP 148/86 | HR 87 | Resp 18 | Ht 61.0 in | Wt 163.8 lb

## 2022-07-01 DIAGNOSIS — R52 Pain, unspecified: Secondary | ICD-10-CM

## 2022-07-01 DIAGNOSIS — I83813 Varicose veins of bilateral lower extremities with pain: Secondary | ICD-10-CM | POA: Diagnosis not present

## 2022-07-01 DIAGNOSIS — R0989 Other specified symptoms and signs involving the circulatory and respiratory systems: Secondary | ICD-10-CM

## 2022-07-03 ENCOUNTER — Encounter: Payer: Self-pay | Admitting: Family Medicine

## 2022-07-04 ENCOUNTER — Encounter (INDEPENDENT_AMBULATORY_CARE_PROVIDER_SITE_OTHER): Payer: Self-pay | Admitting: Nurse Practitioner

## 2022-07-04 NOTE — Progress Notes (Signed)
Subjective:    Patient ID: Stacy Phelps, female    DOB: 1971/05/15, 51 y.o.   MRN: [id number] Chief Complaint  Patient presents with   Establish Care    Referred by Dr Channing Mutters is a 51 year old female who presents today as a referral from Dr. Laural Benes in regards to varicose veins and pain.  The patient works as a Runner, broadcasting/film/video and she notes that she has some occasional right leg swelling and she has pain in her left calf and it hurts sometimes.  She notes that the pain tends to be worse in the end of the day after working.  She does have notable varicosities in the area.  She denies any open wounds or ulcerations.  She has previously not utilize compression.  Today noninvasive study showed no evidence of DVT or superficial thrombophlebitis bilaterally.  He has no evidence of deep venous insufficiency bilaterally.  The patient has evidence of reflux in the right great saphenous vein.    Review of Systems  Cardiovascular:  Positive for leg swelling.  All other systems reviewed and are negative.      Objective:   Physical Exam Vitals reviewed.  HENT:     Head: Normocephalic.  Cardiovascular:     Rate and Rhythm: Normal rate.     Pulses: Normal pulses.  Pulmonary:     Effort: Pulmonary effort is normal.  Skin:    General: Skin is warm and dry.  Neurological:     Mental Status: She is alert and oriented to person, place, and time.  Psychiatric:        Mood and Affect: Mood normal.        Behavior: Behavior normal.        Thought Content: Thought content normal.        Judgment: Judgment normal.     BP (!) 148/86 (BP Location: Left Arm)   Pulse 87   Resp 18   Ht 5\' 1"  (1.549 m)   Wt 163 lb 12.8 oz (74.3 kg)   LMP  (LMP Unknown)   BMI 30.95 kg/m   Past Medical History:  Diagnosis Date   Allergy    Anxiety    Arthritis October 2021   2 steroid injections   Depression    GERD (gastroesophageal reflux disease) November   Doesn't seem to be related to any  specific food   Gout    Gout    Obesity     Social History   Socioeconomic History   Marital status: Married    Spouse name: Not on file   Number of children: Not on file   Years of education: Not on file   Highest education level: Not on file  Occupational History   Not on file  Tobacco Use   Smoking status: Never   Smokeless tobacco: Never  Vaping Use   Vaping Use: Never used  Substance and Sexual Activity   Alcohol use: No    Alcohol/week: 0.0 standard drinks of alcohol   Drug use: No   Sexual activity: Yes    Birth control/protection: Surgical  Other Topics Concern   Not on file  Social History Narrative   Not on file   Social Determinants of Health   Financial Resource Strain: Not on file  Food Insecurity: Not on file  Transportation Needs: Not on file  Physical Activity: Not on file  Stress: Not on file  Social Connections: Not on file  Intimate Partner Violence:  Not on file    Past Surgical History:  Procedure Laterality Date   CHOLECYSTECTOMY     COLONOSCOPY WITH PROPOFOL N/A 03/11/2021   Procedure: COLONOSCOPY WITH PROPOFOL;  Surgeon: Wyline Mood, MD;  [facility]: [facility] ENDOSCOPY;  Service: Gastroenterology;  Laterality: N/A;   TOTAL ABDOMINAL HYSTERECTOMY      Family History  Problem Relation Age of Onset   Diabetes Mother    Thyroid disease Mother    Hypertension Father    Heart disease Father    Diabetes Father    Diabetes Maternal Grandmother    Hypertension Paternal Grandmother    Stroke Paternal Grandmother    Hypertension Paternal Grandfather    Cancer Paternal Grandfather        bone   Stroke Paternal Grandfather    Anxiety disorder Sister    Breast cancer Neg Hx     Allergies  Allergen Reactions   Acetaminophen Nausea And Vomiting       Latest Ref Rng & Units 12/26/2021    3:51 PM 05/01/2021    4:36 PM 12/23/2020    4:15 PM  CBC  WBC 3.4 - 10.8 x10E3/uL 5.0  5.4  5.9   Hemoglobin 11.1 - 15.9 g/dL 78.2  95.6  21.3   Hematocrit  34.0 - 46.6 % 38.0  38.5  40.6   Platelets 150 - 450 x10E3/uL 200  208  253       CMP     Component Value Date/Time   NA 137 12/26/2021 1551   K 3.7 12/26/2021 1551   CL 96 12/26/2021 1551   CO2 24 12/26/2021 1551   GLUCOSE 79 12/26/2021 1551   BUN 16 12/26/2021 1551   CREATININE 0.64 12/26/2021 1551   CALCIUM 9.2 12/26/2021 1551   PROT 7.5 12/26/2021 1551   ALBUMIN 4.7 12/26/2021 1551   AST 24 12/26/2021 1551   ALT 28 12/26/2021 1551   ALKPHOS 66 12/26/2021 1551   BILITOT 0.5 12/26/2021 1551   GFRNONAA 101 12/23/2020 1615   GFRAA 117 12/23/2020 1615     No results found.     Assessment & Plan:   1. Varicose veins of both lower extremities with pain  Recommend:  The patient has large symptomatic varicose veins that are painful and associated with swelling.  I have had a long discussion with the patient regarding  varicose veins and why they cause symptoms.  Patient will begin wearing graduated compression stockings class 1 on a daily basis, beginning first thing in the morning and removing them in the evening. The patient is instructed specifically not to sleep in the stockings.    The patient  will also begin using over-the-counter analgesics such as Motrin 600 mg po TID to help control the symptoms.    In addition, behavioral modification including elevation during the day will be initiated.    Pending the results of these changes the  patient will be reevaluated in three months.   An ultrasound of the venous system will be obtained.   Further plans will be based on the ultrasound results and whether conservative therapies are successful at eliminating the pain and swelling.     Current Outpatient Medications on File Prior to Visit  Medication Sig Dispense Refill   mometasone (NASONEX) 50 MCG/ACT nasal spray SHAKE LIQUID AND USE 2 SPRAYS INTO EACH NOSTRIL DAILY 17 g 12   pantoprazole (PROTONIX) 40 MG tablet TAKE 1 TABLET(40 MG) BY MOUTH DAILY 90 tablet 1   No  current facility-administered medications  on file prior to visit.    There are no Patient Instructions on file for this visit. No follow-ups on file.   Kris Hartmann, NP

## 2022-09-21 ENCOUNTER — Encounter (INDEPENDENT_AMBULATORY_CARE_PROVIDER_SITE_OTHER): Payer: Self-pay

## 2022-10-05 ENCOUNTER — Encounter: Payer: Self-pay | Admitting: Family Medicine

## 2022-10-14 ENCOUNTER — Ambulatory Visit (INDEPENDENT_AMBULATORY_CARE_PROVIDER_SITE_OTHER): Payer: BC Managed Care – PPO | Admitting: Nurse Practitioner

## 2022-10-14 ENCOUNTER — Encounter (INDEPENDENT_AMBULATORY_CARE_PROVIDER_SITE_OTHER): Payer: Self-pay | Admitting: Nurse Practitioner

## 2022-10-14 VITALS — BP 142/90 | HR 74 | Resp 18 | Ht 62.0 in | Wt 169.8 lb

## 2022-10-14 DIAGNOSIS — I83813 Varicose veins of bilateral lower extremities with pain: Secondary | ICD-10-CM

## 2022-10-14 NOTE — Progress Notes (Signed)
Subjective:    Patient ID: Stacy Phelps, female    DOB: 1971/04/12, 51 y.o.   MRN: [id number] No chief complaint on file.     Stacy Phelps is a 51 year old female who returns today for follow-up evaluation of her bilateral varicose veins.  The patient works as a Runner, broadcasting/film/video and initially presented with occasional right leg swelling and pain in her calf that hurts at times.  Since her last visit she has been wearing medical grade compression stockings on a regular basis.  She notes that the swelling is improved although not completely resolved.  She does note that she continues to have pain in the left calf.  Underlying a small cluster of varicosities.  She notes that this area swells and becomes very tender.  She also engages in elevating her lower extremities as well as activity.  Previous noninvasive study showed no evidence of DVT or superficial venous reflux bilaterally.  No evidence of reflux in the left lower extremity but there is reflux in the right great saphenous vein.     Review of Systems  Cardiovascular:  Positive for leg swelling.  Musculoskeletal:  Positive for joint swelling.  All other systems reviewed and are negative.      Objective:   Physical Exam Vitals reviewed.  HENT:     Head: Normocephalic.  Cardiovascular:     Rate and Rhythm: Normal rate.     Pulses: Normal pulses.  Musculoskeletal:        General: Tenderness present.  Skin:    General: Skin is warm and dry.  Neurological:     Mental Status: She is alert and oriented to person, place, and time.  Psychiatric:        Mood and Affect: Mood normal.        Behavior: Behavior normal.        Thought Content: Thought content normal.        Judgment: Judgment normal.     BP (!) 142/90 (BP Location: Right Arm)   Pulse 74   Resp 18   Ht 5\' 2"  (1.575 m)   Wt 169 lb 12.8 oz (77 kg)   LMP  (LMP Unknown)   BMI 31.06 kg/m   Past Medical History:  Diagnosis Date   Allergy    Anxiety    Arthritis October  2021   2 steroid injections   Depression    GERD (gastroesophageal reflux disease) November   Doesn't seem to be related to any specific food   Gout    Gout    Obesity     Social History   Socioeconomic History   Marital status: Married    Spouse name: Not on file   Number of children: Not on file   Years of education: Not on file   Highest education level: Not on file  Occupational History   Not on file  Tobacco Use   Smoking status: Never   Smokeless tobacco: Never  Vaping Use   Vaping Use: Never used  Substance and Sexual Activity   Alcohol use: No    Alcohol/week: 0.0 standard drinks of alcohol   Drug use: No   Sexual activity: Yes    Birth control/protection: Surgical  Other Topics Concern   Not on file  Social History Narrative   Not on file   Social Determinants of Health   Financial Resource Strain: Not on file  Food Insecurity: Not on file  Transportation Needs: Not on file  Physical Activity: Not  on file  Stress: Not on file  Social Connections: Not on file  Intimate Partner Violence: Not on file    Past Surgical History:  Procedure Laterality Date   CHOLECYSTECTOMY     COLONOSCOPY WITH PROPOFOL N/A 03/11/2021   Procedure: COLONOSCOPY WITH PROPOFOL;  Surgeon: Wyline Mood, MD;  Location: [facility] ENDOSCOPY;  Service: Gastroenterology;  Laterality: N/A;   TOTAL ABDOMINAL HYSTERECTOMY      Family History  Problem Relation Age of Onset   Diabetes Mother    Thyroid disease Mother    Hypertension Father    Heart disease Father    Diabetes Father    Diabetes Maternal Grandmother    Hypertension Paternal Grandmother    Stroke Paternal Grandmother    Hypertension Paternal Grandfather    Cancer Paternal Grandfather        bone   Stroke Paternal Grandfather    Anxiety disorder Sister    Breast cancer Neg Hx     Allergies  Allergen Reactions   Acetaminophen Nausea And Vomiting       Latest Ref Rng & Units 12/26/2021    3:51 PM 05/01/2021    4:36  PM 12/23/2020    4:15 PM  CBC  WBC 3.4 - 10.8 x10E3/uL 5.0  5.4  5.9   Hemoglobin 11.1 - 15.9 g/dL 18.8  41.6  60.6   Hematocrit 34.0 - 46.6 % 38.0  38.5  40.6   Platelets 150 - 450 x10E3/uL 200  208  253       CMP     Component Value Date/Time   NA 137 12/26/2021 1551   K 3.7 12/26/2021 1551   CL 96 12/26/2021 1551   CO2 24 12/26/2021 1551   GLUCOSE 79 12/26/2021 1551   BUN 16 12/26/2021 1551   CREATININE 0.64 12/26/2021 1551   CALCIUM 9.2 12/26/2021 1551   PROT 7.5 12/26/2021 1551   ALBUMIN 4.7 12/26/2021 1551   AST 24 12/26/2021 1551   ALT 28 12/26/2021 1551   ALKPHOS 66 12/26/2021 1551   BILITOT 0.5 12/26/2021 1551   GFRNONAA 101 12/23/2020 1615   GFRAA 117 12/23/2020 1615     No results found.     Assessment & Plan:   1. Varicose veins of both lower extremities with pain Recommend:  The patient  has persistent symptoms of pain and swelling that are having a negative impact on daily life and daily activities, despite good adherence to conservative therapy.  Patient should undergo injection sclerotherapy to treat the residual varicosities.  The risks, benefits and alternative therapies were reviewed in detail with the patient.  All questions were answered.  The patient agrees to proceed with sclerotherapy at their convenience.  The patient will continue wearing the graduated compression stockings and using the over-the-counter pain medications to treat her symptoms.       Current Outpatient Medications on File Prior to Visit  Medication Sig Dispense Refill   ascorbic acid (VITAMIN C) 500 MG tablet Take 500 mg by mouth daily.     B Complex-C (B-COMPLEX WITH VITAMIN C) tablet Take 1 tablet by mouth daily.     cholecalciferol (VITAMIN D3) 25 MCG (1000 UNIT) tablet Take 1,000 Units by mouth daily.     fluticasone (FLONASE) 50 MCG/ACT nasal spray Place into both nostrils daily.     pantoprazole (PROTONIX) 40 MG tablet TAKE 1 TABLET(40 MG) BY MOUTH DAILY 90  tablet 1   No current facility-administered medications on file prior to visit.  There are no Patient Instructions on file for this visit. No follow-ups on file.   Kris Hartmann, NP

## 2022-10-26 ENCOUNTER — Telehealth (INDEPENDENT_AMBULATORY_CARE_PROVIDER_SITE_OTHER): Payer: Self-pay | Admitting: Nurse Practitioner

## 2022-10-26 NOTE — Telephone Encounter (Signed)
LVM for pt TCB and schedule sclero appt  right leg SALINE sclero. no prior auth req. see FB.

## 2022-10-28 ENCOUNTER — Other Ambulatory Visit: Payer: Self-pay | Admitting: Family Medicine

## 2022-10-28 DIAGNOSIS — Z1231 Encounter for screening mammogram for malignant neoplasm of breast: Secondary | ICD-10-CM

## 2022-11-12 ENCOUNTER — Encounter (INDEPENDENT_AMBULATORY_CARE_PROVIDER_SITE_OTHER): Payer: Self-pay | Admitting: Nurse Practitioner

## 2022-11-12 ENCOUNTER — Ambulatory Visit (INDEPENDENT_AMBULATORY_CARE_PROVIDER_SITE_OTHER): Payer: BC Managed Care – PPO | Admitting: Nurse Practitioner

## 2022-11-12 VITALS — BP 136/77 | HR 72 | Resp 16 | Wt 173.0 lb

## 2022-11-12 DIAGNOSIS — I83813 Varicose veins of bilateral lower extremities with pain: Secondary | ICD-10-CM

## 2022-11-13 ENCOUNTER — Ambulatory Visit: Payer: Self-pay

## 2022-11-13 ENCOUNTER — Telehealth: Payer: BC Managed Care – PPO | Admitting: Emergency Medicine

## 2022-11-13 DIAGNOSIS — B9689 Other specified bacterial agents as the cause of diseases classified elsewhere: Secondary | ICD-10-CM

## 2022-11-13 DIAGNOSIS — J019 Acute sinusitis, unspecified: Secondary | ICD-10-CM

## 2022-11-13 DIAGNOSIS — R051 Acute cough: Secondary | ICD-10-CM

## 2022-11-13 MED ORDER — ALBUTEROL SULFATE HFA 108 (90 BASE) MCG/ACT IN AERS: 2.0000 | INHALATION_SPRAY | Freq: Four times a day (QID) | RESPIRATORY_TRACT | 0 refills | Status: DC | PRN

## 2022-11-13 MED ORDER — AMOXICILLIN-POT CLAVULANATE 875-125 MG PO TABS: 1.0000 | ORAL_TABLET | Freq: Two times a day (BID) | ORAL | 0 refills | Status: DC

## 2022-11-13 MED ORDER — SPACER/AERO-HOLDING CHAMBERS DEVI: 1.0000 | 0 refills | Status: DC | PRN

## 2022-11-13 NOTE — Telephone Encounter (Signed)
  Chief Complaint: cough Symptoms: cough with congestion, nasal congestion, hoarseness Frequency: last Saturday  Pertinent Negatives: Patient denies SOB or fever Disposition: [] ED /[] Urgent Care (no appt availability in office) / [] Appointment(In office/virtual)/ [x]  [facility]/ [] Home Care/ [] Refused Recommended Disposition /[] [facility]/ []  Follow-up with PCP Additional Notes: pt has been taking Mucinex DM all week and not helped with sx. Pt has lost voice. No appts today so scheduled pt for virtual UC at 1045.   Summary: cough/lost voice   Husband calling to schedule pt an appt for today.  Pt has a "pretty bad cough" and has lost her voice. Husband is not w/ her.  She had him call b/c she has lost her voice.  No appts today.  Please advise         Reason for Disposition  [1] Continuous (nonstop) coughing interferes with work or school AND [2] no improvement using cough treatment per Care Advice  Answer Assessment - Initial Assessment Questions 1. ONSET: "When did the cough begin?"      Last Saturday  3. SPUTUM: "Describe the color of your sputum" (none, dry cough; clear, white, yellow, green)     Yellow  5. DIFFICULTY BREATHING: "Are you having difficulty breathing?" If Yes, ask: "How bad is it?" (e.g., mild, moderate, severe)    - MILD: No SOB at rest, mild SOB with walking, speaks normally in sentences, can lie down, no retractions, pulse < 100.    - MODERATE: SOB at rest, SOB with minimal exertion and prefers to sit, cannot lie down flat, speaks in phrases, mild retractions, audible wheezing, pulse 100-120.    - SEVERE: Very SOB at rest, speaks in single words, struggling to breathe, sitting hunched forward, retractions, pulse > 120      no 6. FEVER: "Do you have a fever?" If Yes, ask: "What is your temperature, how was it measured, and when did it start?"     no 10. OTHER SYMPTOMS: "Do you have any other symptoms?" (e.g., runny nose, wheezing, chest  pain)       Nasal congestion and chest congestion and cough  Protocols used: Cough - Acute Productive-A-AH

## 2022-11-13 NOTE — Patient Instructions (Signed)
[name], thank you for joining Cathlyn Parsons, NP for today's virtual visit.  While this provider is not your primary care provider (PCP), if your PCP is located in our provider database this encounter information will be shared with them immediately following your visit.   A [facility] MyChart account gives you access to today's visit and all your visits, tests, and labs performed at [facility] " click here if you don't have a [facility] MyChart account or go to [email or web address]  Consent: (Patient) Stacy Phelps provided verbal consent for this virtual visit at the beginning of the encounter.  Current Medications:  Current Outpatient Medications:    albuterol (VENTOLIN HFA) 108 (90 Base) MCG/ACT inhaler, Inhale 2 puffs into the lungs every 6 (six) hours as needed for wheezing or shortness of breath., Disp: 8 g, Rfl: 0   amoxicillin-clavulanate (AUGMENTIN) 875-125 MG tablet, Take 1 tablet by mouth 2 (two) times daily., Disp: 14 tablet, Rfl: 0   Spacer/Aero-Holding Chambers DEVI, 1 each by Does not apply route as needed., Disp: 1 each, Rfl: 0   ascorbic acid (VITAMIN C) 500 MG tablet, Take 500 mg by mouth daily., Disp: , Rfl:    B Complex-C (B-COMPLEX WITH VITAMIN C) tablet, Take 1 tablet by mouth daily., Disp: , Rfl:    cholecalciferol (VITAMIN D3) 25 MCG (1000 UNIT) tablet, Take 1,000 Units by mouth daily., Disp: , Rfl:    fluticasone (FLONASE) 50 MCG/ACT nasal spray, Place into both nostrils daily., Disp: , Rfl:    pantoprazole (PROTONIX) 40 MG tablet, TAKE 1 TABLET(40 MG) BY MOUTH DAILY, Disp: 90 tablet, Rfl: 1   Medications ordered in this encounter:  Meds ordered this encounter  Medications   albuterol (VENTOLIN HFA) 108 (90 Base) MCG/ACT inhaler    Sig: Inhale 2 puffs into the lungs every 6 (six) hours as needed for wheezing or shortness of breath.    Dispense:  8 g    Refill:  0   Spacer/Aero-Holding Chambers DEVI    Sig: 1 each by Does not apply  route as needed.    Dispense:  1 each    Refill:  0   amoxicillin-clavulanate (AUGMENTIN) 875-125 MG tablet    Sig: Take 1 tablet by mouth 2 (two) times daily.    Dispense:  14 tablet    Refill:  0     *If you need refills on other medications prior to your next appointment, please contact your pharmacy*  Follow-Up: Call back or seek an in-person evaluation if the symptoms worsen or if the condition fails to improve as anticipated.  [facility] [phone]  Other Instructions Continue taking Mucinex DM.  The generic guaifenesin/dextromethorphan is okay to use.  I prefer the 12-hour formulations but it is okay to take the 4-hour formulations instead.  Continue using your Flonase and your saline spray.  Try using the inhaler with a spacer to see if that helps relieve that cough and the upper chest congestion you are feeling.  If it does not, and you are feeling increasingly short of breath or develop wheezing or chest tightness, please be seen in urgent care.   If you have been instructed to have an in-person evaluation today at a local Urgent Care facility, please use the link below. It will take you to a list of all of our available [facility], including address, phone number and hours of operation. Please do not delay care.  [facility]  If you or a family member do not have a primary care provider, use the link below to schedule a visit and establish care. When you choose a [facility] primary care physician or advanced practice provider, you gain a long-term partner in health. Find a Primary Care Provider  Learn more about 's in-office and virtual care options: [facility]

## 2022-11-13 NOTE — Progress Notes (Signed)
Virtual Visit Consent   Stacy Phelps, you are scheduled for a virtual visit with a [facility] provider today. Just as with appointments in the office, your consent must be obtained to participate. Your consent will be active for this visit and any virtual visit you may have with one of our providers in the next 365 days. If you have a MyChart account, a copy of this consent can be sent to you electronically.  As this is a virtual visit, video technology does not allow for your provider to perform a traditional examination. This may limit your provider's ability to fully assess your condition. If your provider identifies any concerns that need to be evaluated in person or the need to arrange testing (such as labs, EKG, etc.), we will make arrangements to do so. Although advances in technology are sophisticated, we cannot ensure that it will always work on either your end or our end. If the connection with a video visit is poor, the visit may have to be switched to a telephone visit. With either a video or telephone visit, we are not always able to ensure that we have a secure connection.  By engaging in this virtual visit, you consent to the provision of healthcare and authorize for your insurance to be billed (if applicable) for the services provided during this visit. Depending on your insurance coverage, you may receive a charge related to this service.  I need to obtain your verbal consent now. Are you willing to proceed with your visit today? KEIRA BOHLIN has provided verbal consent on 11/13/2022 for a virtual visit (video or telephone). Cathlyn Parsons, NP  Date: 11/13/2022 10:51 AM  Virtual Visit via Video Note   I, Cathlyn Parsons, connected with  Stacy Phelps  ([id number], [birth year]) on 11/13/22 at 10:45 AM EST by a video-enabled telemedicine application and verified that I am speaking with the correct person using two identifiers.  Location: Patient: Virtual Visit Location Patient:  Home Provider: Virtual Visit Location Provider: Home Office   I discussed the limitations of evaluation and management by telemedicine and the availability of in person appointments. The patient expressed understanding and agreed to proceed.    History of Present Illness: Stacy Phelps is a 51 y.o. who identifies as a female who was assigned female at birth, and is being seen today for a cough.  Patient has been sick for about 8 days.  She just feels like she keeps getting worse and worse.  She has had nasal congestion, postnasal drainage hoarse voice, cough.  She denies fever or chills.  The drainage from her nose is yellow and thick.  She did test her cell for COVID at home and it was negative.  She has been using Mucinex DM, Flonase, and saline spray.  She does working Ambulance person as a Runner, broadcasting/film/video.  She denies purulent sputum but she feels like there is congestion in her chest that she cannot cough up.  She does not feel short of breath and she denies wheezing.  HPI: HPI  Problems:  Patient Active Problem List   Diagnosis Date Noted   GERD (gastroesophageal reflux disease) 12/06/2019   Gout 10/04/2015   Allergic rhinitis 05/31/2015    Allergies:  Allergies  Allergen Reactions   Acetaminophen Nausea And Vomiting   Medications:  Current Outpatient Medications:    albuterol (VENTOLIN HFA) 108 (90 Base) MCG/ACT inhaler, Inhale 2 puffs into the lungs every 6 (six) hours as needed for wheezing or  shortness of breath., Disp: 8 g, Rfl: 0   amoxicillin-clavulanate (AUGMENTIN) 875-125 MG tablet, Take 1 tablet by mouth 2 (two) times daily., Disp: 14 tablet, Rfl: 0   Spacer/Aero-Holding Chambers DEVI, 1 each by Does not apply route as needed., Disp: 1 each, Rfl: 0   ascorbic acid (VITAMIN C) 500 MG tablet, Take 500 mg by mouth daily., Disp: , Rfl:    B Complex-C (B-COMPLEX WITH VITAMIN C) tablet, Take 1 tablet by mouth daily., Disp: , Rfl:    cholecalciferol (VITAMIN D3) 25 MCG (1000 UNIT) tablet,  Take 1,000 Units by mouth daily., Disp: , Rfl:    fluticasone (FLONASE) 50 MCG/ACT nasal spray, Place into both nostrils daily., Disp: , Rfl:    pantoprazole (PROTONIX) 40 MG tablet, TAKE 1 TABLET(40 MG) BY MOUTH DAILY, Disp: 90 tablet, Rfl: 1  Observations/Objective: Patient is well-developed, well-nourished in no acute distress.  Resting comfortably  at home.  Head is normocephalic, atraumatic.  No labored breathing.  Patient sounds congested, and her voice is hoarse. Speech is clear and coherent with logical content.  Patient is alert and oriented at baseline.    Assessment and Plan: 1. Acute bacterial sinusitis  2. Acute cough  I suspect her cough is from postnasal drainage.  This also explains her hoarse voice.  But the fact that she feels like her chest is congested and she has something in there that she needs to cough up but cannot, she and I discussed trying an albuterol inhaler to see if that helps.  She has used one in the past many years ago and is comfortable with trying it.  She understands that if she is increasingly short of breath or feels like she is wheezing or her chest is tight, she will need to seek care in urgent care  Follow Up Instructions: I discussed the assessment and treatment plan with the patient. The patient was provided an opportunity to ask questions and all were answered. The patient agreed with the plan and demonstrated an understanding of the instructions.  A copy of instructions were sent to the patient via MyChart unless otherwise noted below.   The patient was advised to call back or seek an in-person evaluation if the symptoms worsen or if the condition fails to improve as anticipated.  Time:  I spent 12 minutes with the patient via telehealth technology discussing the above problems/concerns.    Cathlyn Parsons, NP

## 2022-11-19 ENCOUNTER — Ambulatory Visit
Admission: RE | Admit: 2022-11-19 | Discharge: 2022-11-19 | Disposition: A | Discharge status: Home / Self Care | Payer: BC Managed Care – PPO | Source: Ambulatory Visit | Attending: Family Medicine | Admitting: Family Medicine

## 2022-11-19 DIAGNOSIS — Z1231 Encounter for screening mammogram for malignant neoplasm of breast: Secondary | ICD-10-CM | POA: Insufficient documentation

## 2022-11-20 ENCOUNTER — Encounter: Payer: Self-pay | Admitting: Physician Assistant

## 2022-11-20 ENCOUNTER — Ambulatory Visit: Payer: BC Managed Care – PPO | Admitting: Physician Assistant

## 2022-11-20 VITALS — BP 137/84 | HR 79 | Temp 99.5°F | Ht 62.0 in | Wt 176.3 lb

## 2022-11-20 DIAGNOSIS — H938X3 Other specified disorders of ear, bilateral: Secondary | ICD-10-CM | POA: Diagnosis not present

## 2022-11-20 DIAGNOSIS — R0981 Nasal congestion: Secondary | ICD-10-CM

## 2022-11-20 NOTE — Patient Instructions (Addendum)
  I would recommend taking an antihistamine to help with the sinus congestion - this would be something like Zyrtec or Allegra Make sure you are using Flonase to help open up the nasal passages - use this for a few weeks to help open up your ears and help with the ear fullness  Use your inhaler as needed to help with coughing and shortness of breath

## 2022-11-20 NOTE — Progress Notes (Signed)
Acute Office Visit   Patient: Stacy Phelps   DOB: December 30, 1970   51 y.o. Female  MRN: [id number] Visit Date: 11/20/2022  Today's healthcare provider: Oswaldo Conroy Lex Linhares, PA-C  Introduced myself to the patient as a Secondary school teacher and provided education on APPs in clinical practice.    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 Patient says she was taking Mucinex DM and says it was causing her to cough more and she has since stopped taking it. Patient says she has constant tickle in the back of her throat.    Subjective    Cough Pertinent negatives include no chills, ear pain, fever, headaches, postnasal drip or sore throat.  Sinusitis Associated symptoms include coughing and sinus pressure. Pertinent negatives include no chills, congestion, ear pain, headaches or sore throat.   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 Patient says she was taking Mucinex DM and says it was causing her to cough more and she has since stopped taking it. Patient says she has constant tickle in the back of her throat.       Last edited by Malen Gauze, CMA on 11/20/2022 10:21 AM.       Patient was seen on 11/13/22 for bacterial sinusitis - given 7 day course of Augmentin plus Albuterol inhaler to assist with symptoms  She has finished her abx this morning and is worried about her ear She was taking the Mucinex Dm for a few days after her televisit but felt like this made her cough more  She states her left ear felt stuffed up and then today she felt like her ear opened up   Productive cough: no- reports her cough has improved  Congestion: mild nasal congestion that is improving  Fever:  no    Medications: Outpatient Medications Prior to Visit  Medication Sig   albuterol (VENTOLIN HFA) 108 (90 Base) MCG/ACT inhaler Inhale 2 puffs into the lungs every 6 (six) hours as needed for wheezing or shortness of breath.   ascorbic acid (VITAMIN C) 500 MG tablet Take 500 mg by mouth daily.   B Complex-C (B-COMPLEX WITH VITAMIN C) tablet Take 1 tablet by mouth daily.   cholecalciferol (VITAMIN D3) 25 MCG (1000 UNIT) tablet Take 1,000 Units by mouth daily.   fluticasone (FLONASE) 50 MCG/ACT nasal spray Place into both nostrils daily.   pantoprazole (PROTONIX) 40 MG tablet TAKE 1 TABLET(40 MG) BY MOUTH DAILY   Spacer/Aero-Holding Chambers DEVI 1 each by Does not apply route as needed.   amoxicillin-clavulanate (AUGMENTIN) 875-125 MG tablet Take 1 tablet by mouth 2 (two) times daily. (Patient not taking: Reported on 11/20/2022)   No facility-administered medications prior to visit.    Review of Systems  Constitutional:  Negative for chills, fatigue and fever.  HENT:  Positive for sinus pressure and sinus pain. Negative for congestion, ear discharge, ear pain, postnasal drip and sore throat.   Respiratory:  Positive for cough.   Neurological:  Negative for headaches.       Objective    BP 137/84   Pulse 79   Temp 99.5 F (37.5 C) (  Oral)   Ht 5\' 2"  (1.575 m)   Wt 176 lb 4.8 oz (80 kg)   LMP  (LMP Unknown)   SpO2 98%   BMI 32.25 kg/m    Physical Exam Vitals reviewed.  Constitutional:      General: She is awake.     Appearance: Normal appearance. She is well-developed and well-groomed.  HENT:     Head: Normocephalic and atraumatic.     Right Ear: Hearing, ear canal and external ear normal. A middle ear effusion is present. Tympanic membrane is not injected, scarred, perforated, erythematous, retracted or bulging.     Left Ear: Hearing, ear canal and external ear normal. A middle ear effusion is present. Tympanic membrane is bulging. Tympanic membrane is not injected,  scarred, perforated, erythematous or retracted.     Mouth/Throat:     Lips: Pink.     Mouth: Mucous membranes are moist.     Pharynx: Oropharynx is clear. Uvula midline. No pharyngeal swelling, oropharyngeal exudate, posterior oropharyngeal erythema or uvula swelling.  Eyes:     Extraocular Movements: Extraocular movements intact.     Conjunctiva/sclera: Conjunctivae normal.     Pupils: Pupils are equal, round, and reactive to light.  Cardiovascular:     Rate and Rhythm: Normal rate and regular rhythm.     Pulses: Normal pulses.     Heart sounds: No murmur heard.    No friction rub. No gallop.  Pulmonary:     Effort: Pulmonary effort is normal.     Breath sounds: Normal breath sounds. No decreased air movement. No decreased breath sounds, wheezing, rhonchi or rales.  Musculoskeletal:     Cervical back: Normal range of motion and neck supple.  Neurological:     General: No focal deficit present.     Mental Status: She is alert and oriented to person, place, and time.  Psychiatric:        Mood and Affect: Mood normal.        Behavior: Behavior normal. Behavior is cooperative.        Thought Content: Thought content normal.        Judgment: Judgment normal.       No results found for any visits on 11/20/22.  Assessment & Plan      No follow-ups on file.      Problem List Items Addressed This Visit   None Visit Diagnoses     Ear fullness, bilateral    -  Primary Acute, ongoing Likely secondary to recent bacterial sinusitis  PE was positive for bilateral mid ear effusions but no signs of otitis media or externa Recommend she use Flonase and antihistamine per preference to assist with fullness and lingering congestion Follow up as needed for persistent or progressing symptoms    Mild nasal congestion     Recommend Flonase and antihistamine of choice for lingering nasal congestion  Follow up as needed for persistent or progressing symptoms         No follow-ups on  file.   I, Dawanna Grauberger E Hamzah Savoca, PA-C, have reviewed all documentation for this visit. The documentation on 11/20/22 for the exam, diagnosis, procedures, and orders are all accurate and complete.   11/22/22, MHS, PA-C [facility]

## 2022-11-23 ENCOUNTER — Encounter (INDEPENDENT_AMBULATORY_CARE_PROVIDER_SITE_OTHER): Payer: Self-pay | Admitting: Nurse Practitioner

## 2022-11-23 NOTE — Progress Notes (Signed)
Varicose veins of bilateral  lower extremity with inflammation (454.1  I83.10) Current Plans   Indication: Patient presents with symptomatic varicose veins of the bilateral  lower extremity.   Procedure: Sclerotherapy using hypertonic saline mixed with 1% Lidocaine was performed on the bilateral lower extremity. Compression wraps were placed. The patient tolerated the procedure well. 

## 2022-12-07 ENCOUNTER — Ambulatory Visit (INDEPENDENT_AMBULATORY_CARE_PROVIDER_SITE_OTHER): Payer: BC Managed Care – PPO | Admitting: Nurse Practitioner

## 2022-12-07 ENCOUNTER — Encounter (INDEPENDENT_AMBULATORY_CARE_PROVIDER_SITE_OTHER): Payer: Self-pay | Admitting: Nurse Practitioner

## 2022-12-07 VITALS — BP 124/84 | HR 75 | Resp 16 | Wt 174.6 lb

## 2022-12-07 DIAGNOSIS — I83813 Varicose veins of bilateral lower extremities with pain: Secondary | ICD-10-CM

## 2022-12-08 ENCOUNTER — Encounter (INDEPENDENT_AMBULATORY_CARE_PROVIDER_SITE_OTHER): Payer: Self-pay | Admitting: Nurse Practitioner

## 2022-12-08 NOTE — Progress Notes (Signed)
Varicose veins of bilateral  lower extremity with inflammation (454.1  I83.10) Current Plans   Indication: Patient presents with symptomatic varicose veins of the bilateral  lower extremity.   Procedure: Sclerotherapy using hypertonic saline mixed with 1% Lidocaine was performed on the bilateral lower extremity. Compression wraps were placed. The patient tolerated the procedure well. 

## 2022-12-31 ENCOUNTER — Encounter: Payer: BC Managed Care – PPO | Admitting: Family Medicine

## 2022-12-31 ENCOUNTER — Other Ambulatory Visit: Payer: Self-pay | Admitting: Family Medicine

## 2022-12-31 NOTE — Telephone Encounter (Signed)
Future visit in 1 month.  Requested Prescriptions  Pending Prescriptions Disp Refills   pantoprazole (PROTONIX) 40 MG tablet [Pharmacy Med Name: PANTOPRAZOLE 40MG  TABLETS] 90 tablet 1    Sig: TAKE 1 TABLET(40 MG) BY MOUTH DAILY     Gastroenterology: Proton Pump Inhibitors Passed - 12/31/2022  1:39 PM      Passed - Valid encounter within last 12 months    Recent Outpatient Visits           1 month ago Ear fullness, bilateral   [place], PA-C   1 year ago Routine general medical examination at a health care facility   [place], [place], DO   1 year ago [place], [place], DO   2 years ago Routine general medical examination at a health care facility   [place], [place] P, DO   3 years ago Gastroesophageal reflux disease, unspecified whether esophagitis present   [place], [place], [place]       Future Appointments             In 1 month Wynetta Emery, Barb Merino, DO [place], [facility]

## 2023-01-07 ENCOUNTER — Encounter (INDEPENDENT_AMBULATORY_CARE_PROVIDER_SITE_OTHER): Payer: Self-pay | Admitting: Nurse Practitioner

## 2023-01-07 ENCOUNTER — Ambulatory Visit (INDEPENDENT_AMBULATORY_CARE_PROVIDER_SITE_OTHER): Payer: BC Managed Care – PPO | Admitting: Nurse Practitioner

## 2023-01-07 VITALS — BP 136/86 | HR 83 | Resp 18 | Ht 62.0 in | Wt 174.0 lb

## 2023-01-07 DIAGNOSIS — I83813 Varicose veins of bilateral lower extremities with pain: Secondary | ICD-10-CM

## 2023-01-10 ENCOUNTER — Encounter (INDEPENDENT_AMBULATORY_CARE_PROVIDER_SITE_OTHER): Payer: Self-pay | Admitting: Nurse Practitioner

## 2023-01-10 NOTE — Progress Notes (Signed)
Varicose veins of bilateral  lower extremity with inflammation (454.1  I83.10) Current Plans   Indication: Patient presents with symptomatic varicose veins of the bilateral  lower extremity.   Procedure: Sclerotherapy using hypertonic saline mixed with 1% Lidocaine was performed on the bilateral lower extremity. Compression wraps were placed. The patient tolerated the procedure well. 

## 2023-02-02 ENCOUNTER — Ambulatory Visit (INDEPENDENT_AMBULATORY_CARE_PROVIDER_SITE_OTHER): Payer: BC Managed Care – PPO | Admitting: Family Medicine

## 2023-02-02 ENCOUNTER — Encounter: Payer: Self-pay | Admitting: Family Medicine

## 2023-02-02 VITALS — BP 138/84 | HR 74 | Temp 98.3°F | Ht 62.0 in | Wt 175.6 lb

## 2023-02-02 DIAGNOSIS — Z0000 Encounter for general adult medical examination without abnormal findings: Secondary | ICD-10-CM

## 2023-02-02 DIAGNOSIS — M109 Gout, unspecified: Secondary | ICD-10-CM

## 2023-02-02 DIAGNOSIS — K219 Gastro-esophageal reflux disease without esophagitis: Secondary | ICD-10-CM

## 2023-02-02 MED ORDER — PANTOPRAZOLE SODIUM 40 MG PO TBEC: DELAYED_RELEASE_TABLET | ORAL | 3 refills | Status: DC

## 2023-02-02 NOTE — Assessment & Plan Note (Signed)
Under good control on current regimen. Continue current regimen. Continue to monitor. Call with any concerns. Refills given. Labs drawn today.   

## 2023-02-02 NOTE — Assessment & Plan Note (Signed)
Rechecking labs today. Await results. Treat as needed.  °

## 2023-02-02 NOTE — Progress Notes (Signed)
BP 138/84   Pulse 74   Temp 98.3 F (36.8 C) (Oral)   Ht '5\' 2"'$  (1.575 m)   Wt 175 lb 9.6 oz (79.7 kg)   LMP  (LMP Unknown)   SpO2 99%   BMI 32.12 kg/m    Subjective:    Patient ID: Stacy Phelps, female    DOB: 1971-10-19, 52 y.o.   MRN: [id number]  HPI: Stacy Phelps is a 52 y.o. female presenting on 02/02/2023 for comprehensive medical examination. Current medical complaints include:  Has quite a bit of arthritis- has been to [facility]. Hoping to take something OTC doesn't want be taking medicine if she doesn't have to.   Menopausal Symptoms: yes  Depression Screen done today and results listed below:     02/02/2023    4:20 PM 11/20/2022   10:24 AM 12/26/2021    3:49 PM 12/23/2020    4:16 PM 12/06/2019    4:22 PM  Depression screen PHQ 2/9  Decreased Interest 0 0 0 0 0  Down, Depressed, Hopeless 0 0 0 0 0  PHQ - 2 Score 0 0 0 0 0  Altered sleeping 0 0 0  0  Tired, decreased energy 0 0 0  0  Change in appetite 0 0 0  0  Feeling bad or failure about yourself  0 0 0  0  Trouble concentrating 0 0 0  0  Moving slowly or fidgety/restless 0 0 0  0  Suicidal thoughts 0 0 0  0  PHQ-9 Score 0 0 0  0  Difficult doing work/chores  Not difficult at all       Past Medical History:  Past Medical History:  Diagnosis Date   Allergy    Anxiety    Arthritis October 2021   2 steroid injections   Depression    GERD (gastroesophageal reflux disease) November   Doesn't seem to be related to any specific food   Gout    Gout    Obesity     Surgical History:  Past Surgical History:  Procedure Laterality Date   CHOLECYSTECTOMY     COLONOSCOPY WITH PROPOFOL N/A 03/11/2021   Procedure: COLONOSCOPY WITH PROPOFOL;  Surgeon: Jonathon Bellows, MD;  Location: [facility] ENDOSCOPY;  Service: Gastroenterology;  Laterality: N/A;   TOTAL ABDOMINAL HYSTERECTOMY      Medications:  Current Outpatient Medications on File Prior to Visit  Medication Sig   albuterol (VENTOLIN HFA) 108 (90 Base)  MCG/ACT inhaler Inhale 2 puffs into the lungs every 6 (six) hours as needed for wheezing or shortness of breath.   ascorbic acid (VITAMIN C) 500 MG tablet Take 500 mg by mouth daily.   B Complex-C (B-COMPLEX WITH VITAMIN C) tablet Take 1 tablet by mouth daily.   cetirizine (ZYRTEC) 10 MG tablet Take 10 mg by mouth daily.   cholecalciferol (VITAMIN D3) 25 MCG (1000 UNIT) tablet Take 1,000 Units by mouth daily.   mometasone (NASONEX) 50 MCG/ACT nasal spray Place 1 spray into the nose as needed.   Spacer/Aero-Holding Chambers DEVI 1 each by Does not apply route as needed.   No current facility-administered medications on file prior to visit.    Allergies:  Allergies  Allergen Reactions   Acetaminophen Nausea And Vomiting    Social History:  Social History   Socioeconomic History   Marital status: Married    Spouse name: Not on file   Number of children: Not on file   Years of education: Not on  file   Highest education level: Not on file  Occupational History   Not on file  Tobacco Use   Smoking status: Never   Smokeless tobacco: Never  Vaping Use   Vaping Use: Never used  Substance and Sexual Activity   Alcohol use: No    Alcohol/week: 0.0 standard drinks of alcohol   Drug use: No   Sexual activity: Yes    Birth control/protection: Surgical  Other Topics Concern   Not on file  Social History Narrative   Not on file   Social Determinants of Health   Financial Resource Strain: Not on file  Food Insecurity: Not on file  Transportation Needs: Not on file  Physical Activity: Not on file  Stress: Not on file  Social Connections: Not on file  Intimate Partner Violence: Not on file   Social History   Tobacco Use  Smoking Status Never  Smokeless Tobacco Never   Social History   Substance and Sexual Activity  Alcohol Use No   Alcohol/week: 0.0 standard drinks of alcohol    Family History:  Family History  Problem Relation Age of Onset   Diabetes Mother     Thyroid disease Mother    Hypertension Father    Heart disease Father    Diabetes Father    Diabetes Maternal Grandmother    Hypertension Paternal Grandmother    Stroke Paternal Grandmother    Hypertension Paternal Grandfather    Cancer Paternal Grandfather        bone   Stroke Paternal Grandfather    Anxiety disorder Sister    Breast cancer Neg Hx     Past medical history, surgical history, medications, allergies, family history and social history reviewed with patient today and changes made to appropriate areas of the chart.   Review of Systems  Constitutional:  Positive for diaphoresis. Negative for chills, fever, malaise/fatigue and weight loss.  HENT:  Positive for congestion. Negative for ear discharge, ear pain, hearing loss, nosebleeds, sinus pain, sore throat and tinnitus.   Eyes: Negative.   Respiratory: Negative.  Negative for stridor.   Cardiovascular:  Positive for palpitations. Negative for chest pain, orthopnea, claudication, leg swelling and PND.  Gastrointestinal:  Positive for heartburn. Negative for abdominal pain, blood in stool, constipation, diarrhea, melena, nausea and vomiting.  Genitourinary: Negative.   Musculoskeletal:  Positive for joint pain. Negative for back pain, falls, myalgias and neck pain.  Skin:  Positive for itching. Negative for rash.  Neurological:  Positive for tingling. Negative for dizziness, tremors, sensory change, speech change, focal weakness, seizures, loss of consciousness, weakness and headaches.  Endo/Heme/Allergies:  Positive for environmental allergies. Negative for polydipsia. Does not bruise/bleed easily.  Psychiatric/Behavioral: Negative.     All other ROS negative except what is listed above and in the HPI.      Objective:    BP 138/84   Pulse 74   Temp 98.3 F (36.8 C) (Oral)   Ht '5\' 2"'$  (1.575 m)   Wt 175 lb 9.6 oz (79.7 kg)   LMP  (LMP Unknown)   SpO2 99%   BMI 32.12 kg/m   Wt Readings from Last 3 Encounters:   02/02/23 175 lb 9.6 oz (79.7 kg)  01/07/23 174 lb (78.9 kg)  12/07/22 174 lb 9.6 oz (79.2 kg)    Physical Exam Vitals and nursing note reviewed.  Constitutional:      General: She is not in acute distress.    Appearance: Normal appearance. She is normal weight. She  is not ill-appearing, toxic-appearing or diaphoretic.  HENT:     Head: Normocephalic and atraumatic.     Right Ear: Tympanic membrane, ear canal and external ear normal. There is no impacted cerumen.     Left Ear: Tympanic membrane, ear canal and external ear normal. There is no impacted cerumen.     Nose: Nose normal. No congestion or rhinorrhea.     Mouth/Throat:     Mouth: Mucous membranes are moist.     Pharynx: Oropharynx is clear. No oropharyngeal exudate or posterior oropharyngeal erythema.  Eyes:     General: No scleral icterus.       Right eye: No discharge.        Left eye: No discharge.     Extraocular Movements: Extraocular movements intact.     Conjunctiva/sclera: Conjunctivae normal.     Pupils: Pupils are equal, round, and reactive to light.  Neck:     Vascular: No carotid bruit.  Cardiovascular:     Rate and Rhythm: Normal rate and regular rhythm.     Pulses: Normal pulses.     Heart sounds: No murmur heard.    No friction rub. No gallop.  Pulmonary:     Effort: Pulmonary effort is normal. No respiratory distress.     Breath sounds: Normal breath sounds. No stridor. No wheezing, rhonchi or rales.  Chest:     Chest wall: No tenderness.  Abdominal:     General: Abdomen is flat. Bowel sounds are normal. There is no distension.     Palpations: Abdomen is soft. There is no mass.     Tenderness: There is no abdominal tenderness. There is no right CVA tenderness, left CVA tenderness, guarding or rebound.     Hernia: No hernia is present.  Genitourinary:    Comments: Breast and pelvic exams deferred with shared decision making Musculoskeletal:        General: No swelling, tenderness, deformity or signs  of injury. Normal range of motion.     Cervical back: Normal range of motion and neck supple. No rigidity. No muscular tenderness.     Right lower leg: No edema.     Left lower leg: No edema.  Lymphadenopathy:     Cervical: No cervical adenopathy.  Skin:    General: Skin is warm and dry.     Capillary Refill: Capillary refill takes less than 2 seconds.     Coloration: Skin is not jaundiced or pale.     Findings: No bruising, erythema, lesion or rash.  Neurological:     General: No focal deficit present.     Mental Status: She is alert and oriented to person, place, and time. Mental status is at baseline.     Cranial Nerves: No cranial nerve deficit.     Sensory: No sensory deficit.     Motor: No weakness.     Coordination: Coordination normal.     Gait: Gait normal.     Deep Tendon Reflexes: Reflexes normal.  Psychiatric:        Mood and Affect: Mood normal.        Behavior: Behavior normal.        Thought Content: Thought content normal.        Judgment: Judgment normal.     Results for orders placed or performed in visit on 12/26/21  CBC with Differential/Platelet  Result Value Ref Range   WBC 5.0 3.4 - 10.8 x10E3/uL   RBC 4.23 3.77 - 5.28 x10E6/uL   Hemoglobin  13.0 11.1 - 15.9 g/dL   Hematocrit 38.0 34.0 - 46.6 %   MCV 90 79 - 97 fL   MCH 30.7 26.6 - 33.0 pg   MCHC 34.2 31.5 - 35.7 g/dL   RDW 11.7 11.7 - 15.4 %   Platelets 200 150 - 450 x10E3/uL   Neutrophils 63 Not Estab. %   Lymphs 25 Not Estab. %   Monocytes 9 Not Estab. %   Eos 2 Not Estab. %   Basos 1 Not Estab. %   Neutrophils Absolute 3.2 1.4 - 7.0 x10E3/uL   Lymphocytes Absolute 1.3 0.7 - 3.1 x10E3/uL   Monocytes Absolute 0.5 0.1 - 0.9 x10E3/uL   EOS (ABSOLUTE) 0.1 0.0 - 0.4 x10E3/uL   Basophils Absolute 0.0 0.0 - 0.2 x10E3/uL   Immature Granulocytes 0 Not Estab. %   Immature Grans (Abs) 0.0 0.0 - 0.1 x10E3/uL  Comprehensive metabolic panel  Result Value Ref Range   Glucose 79 70 - 99 mg/dL   BUN 16 6  - 24 mg/dL   Creatinine, Ser 0.64 0.57 - 1.00 mg/dL   eGFR 107 >59 mL/min/1.73   BUN/Creatinine Ratio 25 (H) 9 - 23   Sodium 137 134 - 144 mmol/L   Potassium 3.7 3.5 - 5.2 mmol/L   Chloride 96 96 - 106 mmol/L   CO2 24 20 - 29 mmol/L   Calcium 9.2 8.7 - 10.2 mg/dL   Total Protein 7.5 6.0 - 8.5 g/dL   Albumin 4.7 3.8 - 4.9 g/dL   Globulin, Total 2.8 1.5 - 4.5 g/dL   Albumin/Globulin Ratio 1.7 1.2 - 2.2   Bilirubin Total 0.5 0.0 - 1.2 mg/dL   Alkaline Phosphatase 66 44 - 121 IU/L   AST 24 0 - 40 IU/L   ALT 28 0 - 32 IU/L  Lipid Panel w/o Chol/HDL Ratio  Result Value Ref Range   Cholesterol, Total 184 100 - 199 mg/dL   Triglycerides 54 0 - 149 mg/dL   HDL 62 >39 mg/dL   VLDL Cholesterol Cal 10 5 - 40 mg/dL   LDL Chol Calc (NIH) 112 (H) 0 - 99 mg/dL  Urinalysis, Routine w reflex microscopic  Result Value Ref Range   Specific Gravity, UA 1.010 1.005 - 1.030   pH, UA 6.0 5.0 - 7.5   Color, UA Yellow Yellow   Appearance Ur Clear Clear   Leukocytes,UA Negative Negative   Protein,UA Negative Negative/Trace   Glucose, UA Negative Negative   Ketones, UA Negative Negative   RBC, UA Negative Negative   Bilirubin, UA Negative Negative   Urobilinogen, Ur 0.2 0.2 - 1.0 mg/dL   Nitrite, UA Negative Negative  TSH  Result Value Ref Range   TSH 2.420 0.450 - 4.500 uIU/mL  Uric acid  Result Value Ref Range   Uric Acid 4.8 3.0 - 7.2 mg/dL      Assessment & Plan:   Problem List Items Addressed This Visit       Digestive   GERD (gastroesophageal reflux disease)    Under good control on current regimen. Continue current regimen. Continue to monitor. Call with any concerns. Refills given. Labs drawn today.       Relevant Medications   pantoprazole (PROTONIX) 40 MG tablet     Other   Gout    Rechecking labs today. Await results. Treat as needed.       Relevant Orders   Uric acid   Other Visit Diagnoses     Routine general medical examination at a  health care facility    -   Primary   Vaccines up to date. Screening labs checked today. Mammo and colonoscopy up to date. Continue diet and exercise. Call with any concerns.   Relevant Orders   CBC with Differential/Platelet   Comprehensive metabolic panel   Lipid Panel w/o Chol/HDL Ratio   Urinalysis, Routine w reflex microscopic   TSH        Follow up plan: Return in about 1 year (around 02/02/2024) for physical.   LABORATORY TESTING:  - Pap smear: not applicable  IMMUNIZATIONS:   - Tdap: Tetanus vaccination status reviewed: last tetanus booster within 10 years. - Influenza: Up to date - Pneumovax: Not applicable - Prevnar: Not applicable - COVID: Up to date - HPV: Not applicable - Shingrix vaccine: Up to date  SCREENING: -Mammogram: Up to date  - Colonoscopy: Up to date   PATIENT COUNSELING:   Advised to take 1 mg of folate supplement per day if capable of pregnancy.   Sexuality: Discussed sexually transmitted diseases, partner selection, use of condoms, avoidance of unintended pregnancy  and contraceptive alternatives.   Advised to avoid cigarette smoking.  I discussed with the patient that most people either abstain from alcohol or drink within safe limits (<=14/week and <=4 drinks/occasion for males, <=7/weeks and <= 3 drinks/occasion for females) and that the risk for alcohol disorders and other health effects rises proportionally with the number of drinks per week and how often a drinker exceeds daily limits.  Discussed cessation/primary prevention of drug use and availability of treatment for abuse.   Diet: Encouraged to adjust caloric intake to maintain  or achieve ideal body weight, to reduce intake of dietary saturated fat and total fat, to limit sodium intake by avoiding high sodium foods and not adding table salt, and to maintain adequate dietary potassium and calcium preferably from fresh fruits, vegetables, and low-fat dairy products.    stressed the importance of regular  exercise  Injury prevention: Discussed safety belts, safety helmets, smoke detector, smoking near bedding or upholstery.   Dental health: Discussed importance of regular tooth brushing, flossing, and dental visits.    NEXT PREVENTATIVE PHYSICAL DUE IN 1 YEAR. Return in about 1 year (around 02/02/2024) for physical.

## 2023-02-02 NOTE — Patient Instructions (Signed)
[place]

## 2023-02-03 LAB — URINALYSIS, ROUTINE W REFLEX MICROSCOPIC
Bilirubin, UA: NEGATIVE
Glucose, UA: NEGATIVE
Ketones, UA: NEGATIVE
Leukocytes,UA: NEGATIVE
Nitrite, UA: NEGATIVE
Protein,UA: NEGATIVE
RBC, UA: NEGATIVE
Specific Gravity, UA: <1.005 — ABNORMAL LOW (ref 1.005–1.030)
Urobilinogen, Ur: 0.2 mg/dL (ref 0.2–1.0)
pH, UA: 6 (ref 5.0–7.5)

## 2023-02-03 LAB — CBC WITH DIFFERENTIAL/PLATELET
Basophils Absolute: 0 10*3/uL (ref 0.0–0.2)
Basos: 1 %
EOS (ABSOLUTE): 0.1 10*3/uL (ref 0.0–0.4)
Eos: 2 %
Hematocrit: 37.7 % (ref 34.0–46.6)
Hemoglobin: 12.9 g/dL (ref 11.1–15.9)
Immature Grans (Abs): 0 10*3/uL (ref 0.0–0.1)
Immature Granulocytes: 0 %
Lymphocytes Absolute: 1.5 10*3/uL (ref 0.7–3.1)
Lymphs: 25 %
MCH: 30.1 pg (ref 26.6–33.0)
MCHC: 34.2 g/dL (ref 31.5–35.7)
MCV: 88 fL (ref 79–97)
Monocytes Absolute: 0.5 10*3/uL (ref 0.1–0.9)
Monocytes: 8 %
Neutrophils Absolute: 3.8 10*3/uL (ref 1.4–7.0)
Neutrophils: 64 %
Platelets: 257 10*3/uL (ref 150–450)
RBC: 4.29 x10E6/uL (ref 3.77–5.28)
RDW: 11.7 % (ref 11.7–15.4)
WBC: 6 10*3/uL (ref 3.4–10.8)

## 2023-02-03 LAB — TSH: TSH: 2.01 u[IU]/mL (ref 0.450–4.500)

## 2023-02-03 LAB — COMPREHENSIVE METABOLIC PANEL
ALT: 21 IU/L (ref 0–32)
AST: 19 IU/L (ref 0–40)
Albumin/Globulin Ratio: 1.7 (ref 1.2–2.2)
Albumin: 4.8 g/dL (ref 3.8–4.9)
Alkaline Phosphatase: 93 IU/L (ref 44–121)
BUN/Creatinine Ratio: 18 (ref 9–23)
BUN: 12 mg/dL (ref 6–24)
Bilirubin Total: 0.6 mg/dL (ref 0.0–1.2)
CO2: 24 mmol/L (ref 20–29)
Calcium: 9.7 mg/dL (ref 8.7–10.2)
Chloride: 99 mmol/L (ref 96–106)
Creatinine, Ser: 0.66 mg/dL (ref 0.57–1.00)
Globulin, Total: 2.8 g/dL (ref 1.5–4.5)
Glucose: 85 mg/dL (ref 70–99)
Potassium: 3.6 mmol/L (ref 3.5–5.2)
Sodium: 139 mmol/L (ref 134–144)
Total Protein: 7.6 g/dL (ref 6.0–8.5)
eGFR: 105 mL/min/{1.73_m2} (ref >59)

## 2023-02-03 LAB — LIPID PANEL W/O CHOL/HDL RATIO
Cholesterol, Total: 192 mg/dL (ref 100–199)
HDL: 56 mg/dL (ref >39)
LDL Chol Calc (NIH): 115 mg/dL — ABNORMAL HIGH (ref 0–99)
Triglycerides: 115 mg/dL (ref 0–149)
VLDL Cholesterol Cal: 21 mg/dL (ref 5–40)

## 2023-02-03 LAB — URIC ACID: Uric Acid: 4.1 mg/dL (ref 3.0–7.2)

## 2023-02-04 ENCOUNTER — Ambulatory Visit (INDEPENDENT_AMBULATORY_CARE_PROVIDER_SITE_OTHER): Payer: BC Managed Care – PPO | Admitting: Nurse Practitioner

## 2023-02-04 ENCOUNTER — Encounter (INDEPENDENT_AMBULATORY_CARE_PROVIDER_SITE_OTHER): Payer: Self-pay | Admitting: Nurse Practitioner

## 2023-02-04 VITALS — BP 124/85 | HR 81 | Resp 16 | Wt 175.4 lb

## 2023-02-04 DIAGNOSIS — I83813 Varicose veins of bilateral lower extremities with pain: Secondary | ICD-10-CM | POA: Diagnosis not present

## 2023-02-05 ENCOUNTER — Encounter: Payer: Self-pay | Admitting: Family Medicine

## 2023-02-05 ENCOUNTER — Encounter (INDEPENDENT_AMBULATORY_CARE_PROVIDER_SITE_OTHER): Payer: Self-pay | Admitting: Nurse Practitioner

## 2023-02-05 NOTE — Progress Notes (Signed)
Subjective:    Patient ID: Stacy Phelps, female    DOB: [birth year], 52 y.o.   MRN: [id number] Chief Complaint  Patient presents with   Follow-up    4 week follow up      Stacy Phelps is a 52 year old female who returns today for follow-up evaluation of her bilateral varicose veins.  The patient works as a Pharmacist, hospital and initially presented with occasional right leg swelling and pain in her calf that hurts at times.  Since her last visit she has been wearing medical grade compression stockings on a regular basis.  She notes that the swelling is improved although not completely resolved.  She does note that she continues to have pain in the left calf.  Underlying a small cluster of varicosities.  She notes that this area swells and becomes very tender.  She also engages in elevating her lower extremities as well as activity.  Previous noninvasive study showed no evidence of DVT or superficial venous reflux bilaterally.  No evidence of reflux in the left lower extremity but there is reflux in the right great saphenous vein.     Review of Systems  Cardiovascular:  Positive for leg swelling.  Musculoskeletal:  Positive for joint swelling.  All other systems reviewed and are negative.      Objective:   Physical Exam Vitals reviewed.  HENT:     Head: Normocephalic.  Cardiovascular:     Rate and Rhythm: Normal rate.     Pulses: Normal pulses.  Musculoskeletal:        General: Tenderness present.  Skin:    General: Skin is warm and dry.  Neurological:     Mental Status: She is alert and oriented to person, place, and time.  Psychiatric:        Mood and Affect: Mood normal.        Behavior: Behavior normal.        Thought Content: Thought content normal.        Judgment: Judgment normal.     BP 124/85 (BP Location: Left Arm)   Pulse 81   Resp 16   Wt 175 lb 6.4 oz (79.6 kg)   LMP  (LMP Unknown)   BMI 32.08 kg/m   Past Medical History:  Diagnosis Date   Allergy    Anxiety     Arthritis October 2021   2 steroid injections   Depression    GERD (gastroesophageal reflux disease) November   Doesn't seem to be related to any specific food   Gout    Gout    Obesity     Social History   Socioeconomic History   Marital status: Married    Spouse name: Not on file   Number of children: Not on file   Years of education: Not on file   Highest education level: Not on file  Occupational History   Not on file  Tobacco Use   Smoking status: Never   Smokeless tobacco: Never  Vaping Use   Vaping Use: Never used  Substance and Sexual Activity   Alcohol use: No    Alcohol/week: 0.0 standard drinks of alcohol   Drug use: No   Sexual activity: Yes    Birth control/protection: Surgical  Other Topics Concern   Not on file  Social History Narrative   Not on file   Social Determinants of Health   Financial Resource Strain: Not on file  Food Insecurity: Not on file  Transportation Needs: Not on file  Physical Activity: Not on file  Stress: Not on file  Social Connections: Not on file  Intimate Partner Violence: Not on file    Past Surgical History:  Procedure Laterality Date   CHOLECYSTECTOMY     COLONOSCOPY WITH PROPOFOL N/A 03/11/2021   Procedure: COLONOSCOPY WITH PROPOFOL;  Surgeon: Jonathon Bellows, MD;  Location: [facility] ENDOSCOPY;  Service: Gastroenterology;  Laterality: N/A;   TOTAL ABDOMINAL HYSTERECTOMY      Family History  Problem Relation Age of Onset   Diabetes Mother    Thyroid disease Mother    Hypertension Father    Heart disease Father    Diabetes Father    Diabetes Maternal Grandmother    Hypertension Paternal Grandmother    Stroke Paternal Grandmother    Hypertension Paternal Grandfather    Cancer Paternal Grandfather        bone   Stroke Paternal Grandfather    Anxiety disorder Sister    Breast cancer Neg Hx     Allergies  Allergen Reactions   Acetaminophen Nausea And Vomiting       Latest Ref Rng & Units 02/02/2023    4:26  PM 12/26/2021    3:51 PM 05/01/2021    4:36 PM  CBC  WBC 3.4 - 10.8 x10E3/uL 6.0  5.0  5.4   Hemoglobin 11.1 - 15.9 g/dL 12.9  13.0  13.3   Hematocrit 34.0 - 46.6 % 37.7  38.0  38.5   Platelets 150 - 450 x10E3/uL 257  200  208       CMP     Component Value Date/Time   NA 139 02/02/2023 1626   K 3.6 02/02/2023 1626   CL 99 02/02/2023 1626   CO2 24 02/02/2023 1626   GLUCOSE 85 02/02/2023 1626   BUN 12 02/02/2023 1626   CREATININE 0.66 02/02/2023 1626   CALCIUM 9.7 02/02/2023 1626   PROT 7.6 02/02/2023 1626   ALBUMIN 4.8 02/02/2023 1626   AST 19 02/02/2023 1626   ALT 21 02/02/2023 1626   ALKPHOS 93 02/02/2023 1626   BILITOT 0.6 02/02/2023 1626   GFRNONAA 101 12/23/2020 1615   GFRAA 117 12/23/2020 1615     No results found.     Assessment & Plan:   1. Varicose veins of both lower extremities with pain Recommend:  The patient  has persistent symptoms of pain and swelling that are having a negative impact on daily life and daily activities, despite good adherence to conservative therapy.  Patient should undergo injection sclerotherapy to treat the residual varicosities.  The risks, benefits and alternative therapies were reviewed in detail with the patient.  All questions were answered.  The patient agrees to proceed with sclerotherapy at their convenience.  The patient will continue wearing the graduated compression stockings and using the over-the-counter pain medications to treat her symptoms.       Current Outpatient Medications on File Prior to Visit  Medication Sig Dispense Refill   albuterol (VENTOLIN HFA) 108 (90 Base) MCG/ACT inhaler Inhale 2 puffs into the lungs every 6 (six) hours as needed for wheezing or shortness of breath. 8 g 0   ascorbic acid (VITAMIN C) 500 MG tablet Take 500 mg by mouth daily.     B Complex-C (B-COMPLEX WITH VITAMIN C) tablet Take 1 tablet by mouth daily.     cetirizine (ZYRTEC) 10 MG tablet Take 10 mg by mouth daily.      cholecalciferol (VITAMIN D3) 25 MCG (1000 UNIT) tablet Take 1,000 Units by mouth  daily.     Glucosamine-Chondroitin 250-200 MG TABS Take 2 tablets by mouth daily.     mometasone (NASONEX) 50 MCG/ACT nasal spray Place 1 spray into the nose as needed.     pantoprazole (PROTONIX) 40 MG tablet TAKE 1 TABLET(40 MG) BY MOUTH DAILY 90 tablet 3   Spacer/Aero-Holding Chambers DEVI 1 each by Does not apply route as needed. 1 each 0   No current facility-administered medications on file prior to visit.    There are no Patient Instructions on file for this visit. No follow-ups on file.   Kris Hartmann, NP

## 2023-02-17 ENCOUNTER — Telehealth: Payer: BC Managed Care – PPO | Admitting: Physician Assistant

## 2023-02-17 DIAGNOSIS — B3731 Acute candidiasis of vulva and vagina: Secondary | ICD-10-CM | POA: Diagnosis not present

## 2023-02-18 MED ORDER — FLUCONAZOLE 150 MG PO TABS: 150.0000 mg | ORAL_TABLET | Freq: Once | ORAL | 0 refills | Status: AC

## 2023-02-18 NOTE — Progress Notes (Signed)

## 2023-02-18 NOTE — Progress Notes (Signed)
I have spent 5 minutes in review of e-visit questionnaire, review and updating patient chart, medical decision making and response to patient.   Haskel Dewalt Cody Barnes Florek, PA-C    

## 2023-02-26 ENCOUNTER — Telehealth (INDEPENDENT_AMBULATORY_CARE_PROVIDER_SITE_OTHER): Payer: Self-pay | Admitting: Nurse Practitioner

## 2023-02-26 NOTE — Telephone Encounter (Signed)
Lvm for tp TCB and schedule SALINE sclero appt  bilateral SALINE sclero. see fb. ref # [id number]  (still pending as of 4.5.24) X 3

## 2023-02-27 ENCOUNTER — Telehealth: Payer: BC Managed Care – PPO | Admitting: Nurse Practitioner

## 2023-02-27 DIAGNOSIS — R399 Unspecified symptoms and signs involving the genitourinary system: Secondary | ICD-10-CM

## 2023-02-27 MED ORDER — CEPHALEXIN 500 MG PO CAPS: 500.0000 mg | ORAL_CAPSULE | Freq: Two times a day (BID) | ORAL | 0 refills | Status: AC

## 2023-02-27 MED ORDER — FLUCONAZOLE 150 MG PO TABS: 150.0000 mg | ORAL_TABLET | Freq: Every day | ORAL | 0 refills | Status: DC

## 2023-02-27 NOTE — Progress Notes (Signed)
I have spent 5 minutes in review of e-visit questionnaire, review and updating patient chart, medical decision making and response to patient.  ° °Anuhea Gassner W Johnnye Sandford, NP ° °  °

## 2023-02-27 NOTE — Progress Notes (Signed)

## 2023-02-27 NOTE — Addendum Note (Signed)
Addended by: Bertram Denver on: 02/27/2023 12:14 PM   Modules accepted: Orders

## 2023-03-04 ENCOUNTER — Telehealth (INDEPENDENT_AMBULATORY_CARE_PROVIDER_SITE_OTHER): Payer: Self-pay | Admitting: Nurse Practitioner

## 2023-03-04 NOTE — Telephone Encounter (Signed)
LVM for pt TCB and schedule appt  bilateral SALINE sclero. see fb. ref # [id number] X 3

## 2023-03-11 IMAGING — MG MM DIGITAL SCREENING BILAT W/ TOMO AND CAD
6 of 10 series · 6 of 30 positions shown · non-contrast
Comparison: Previous exam(s).

CLINICAL DATA: Screening.

EXAM:
DIGITAL SCREENING BILATERAL MAMMOGRAM WITH TOMOSYNTHESIS AND CAD
TECHNIQUE: Bilateral screening digital craniocaudal and mediolateral oblique
mammograms were obtained. Bilateral screening digital breast
tomosynthesis was performed. The images were evaluated with
computer-aided detection.

[R MLO synth-2D (1 of 2)]
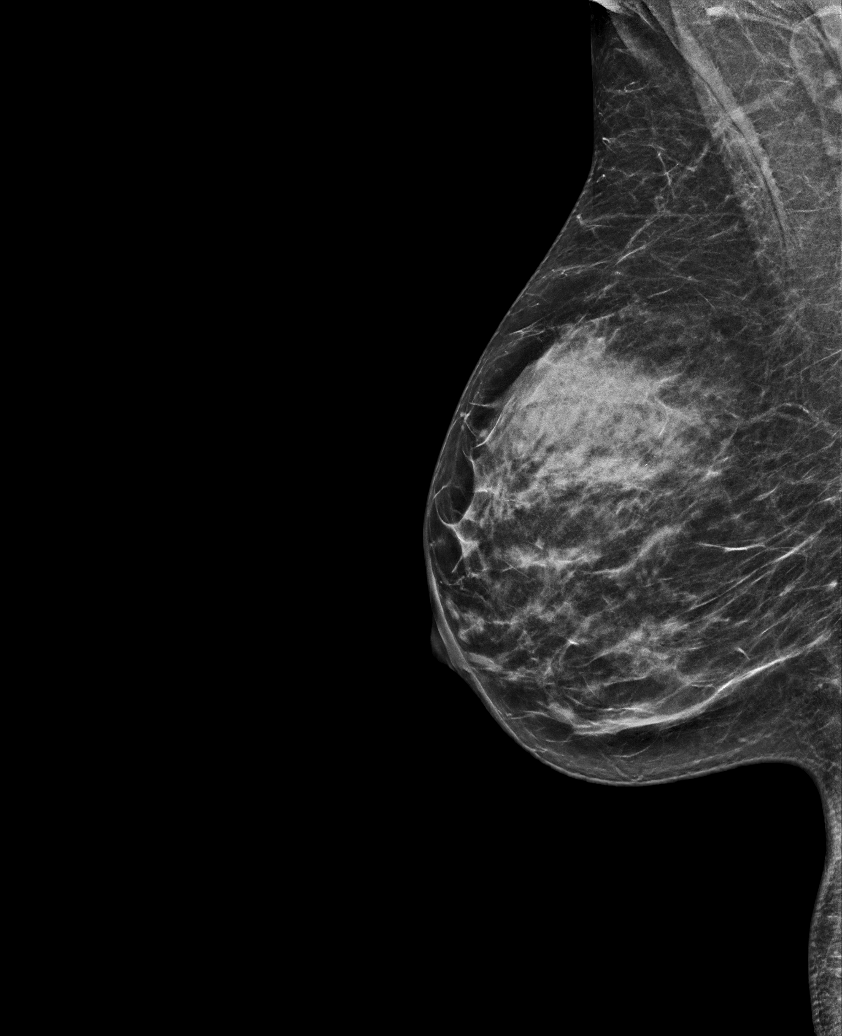

[R MLO synth-2D (2 of 2)]
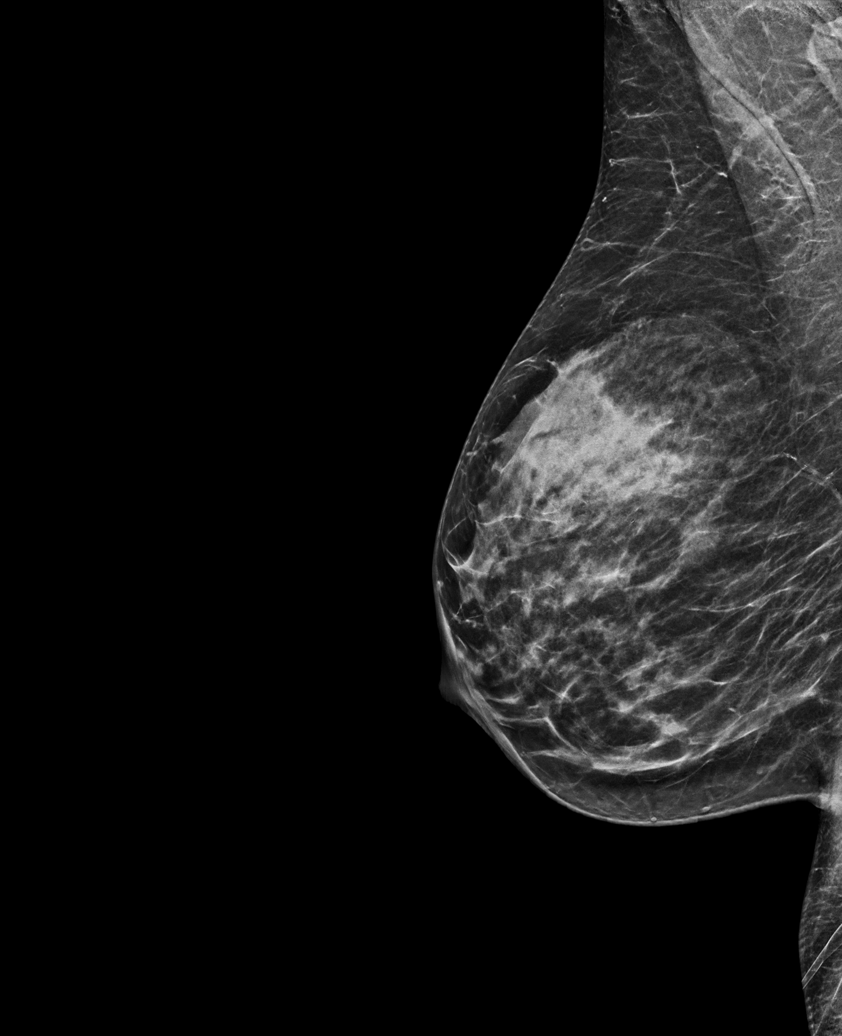

[R CC synth-2D]
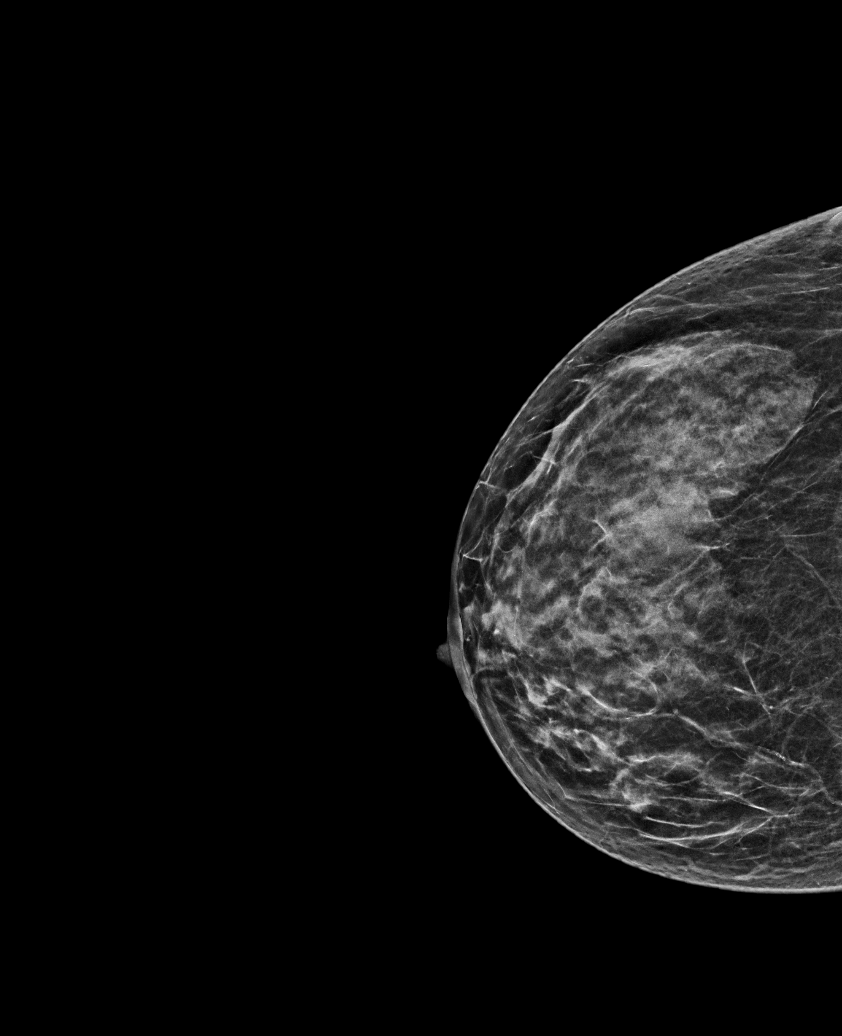

[L MLO synth-2D]
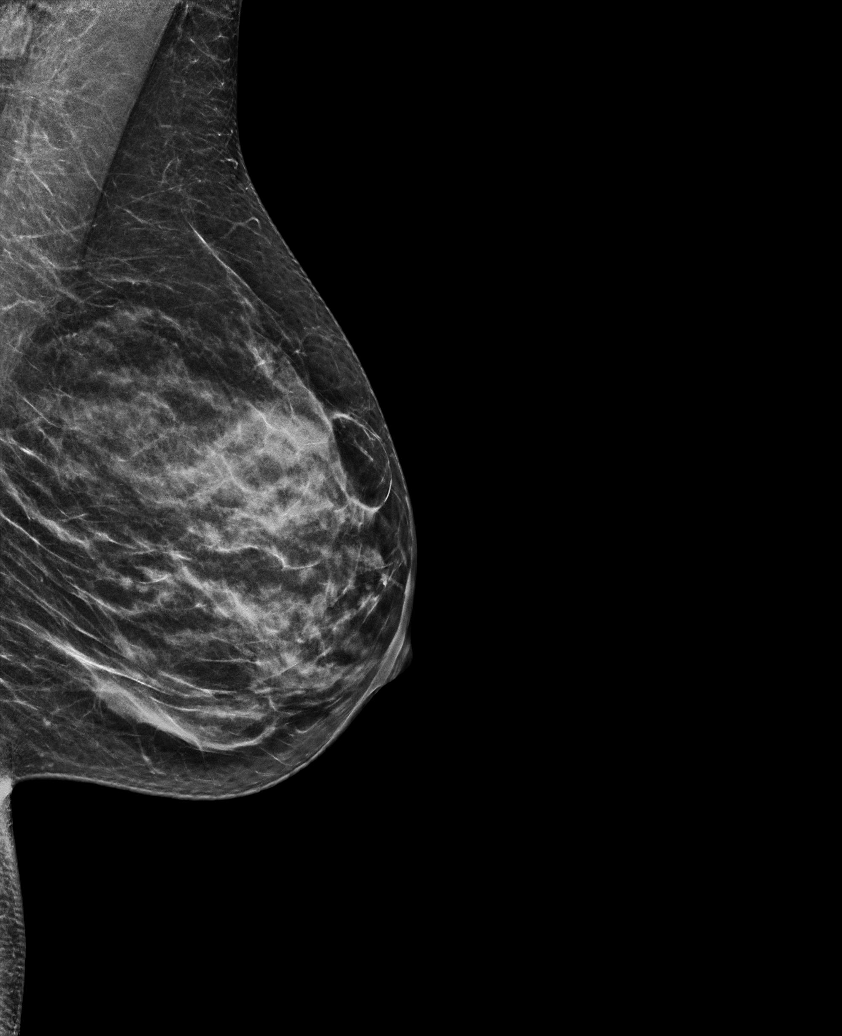

[L CC synth-2D]
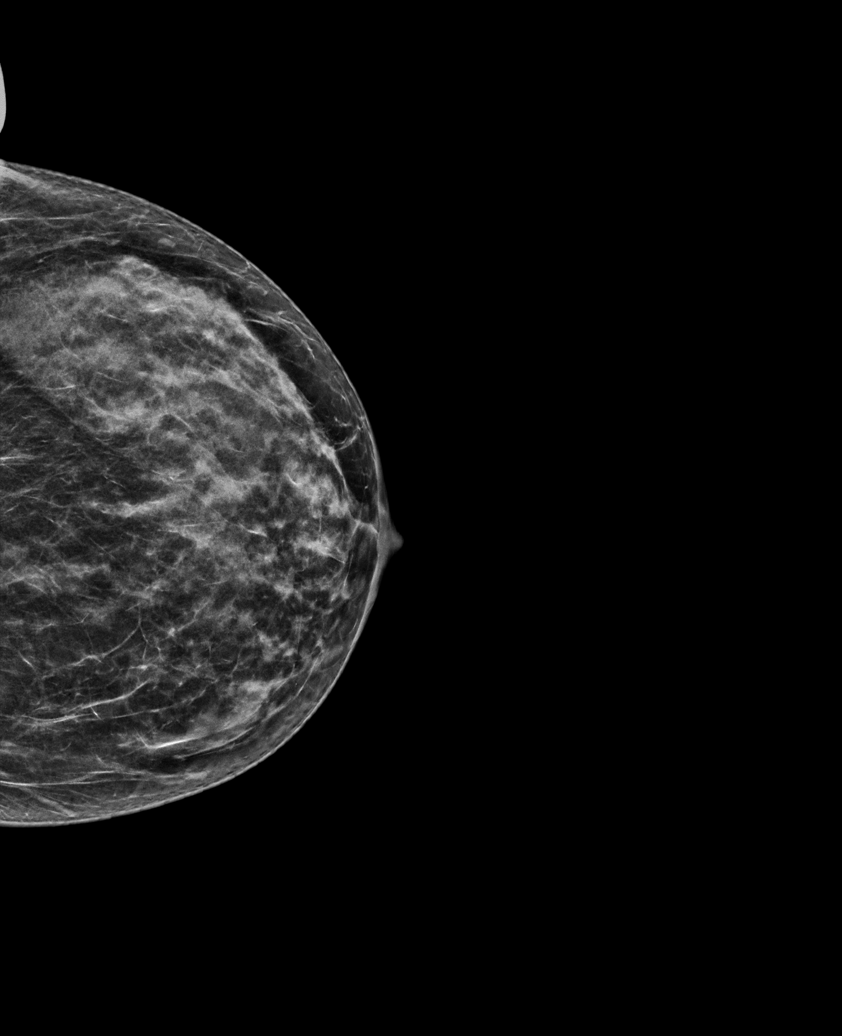

[R MLO tomo · tomo slice 27/53.0]
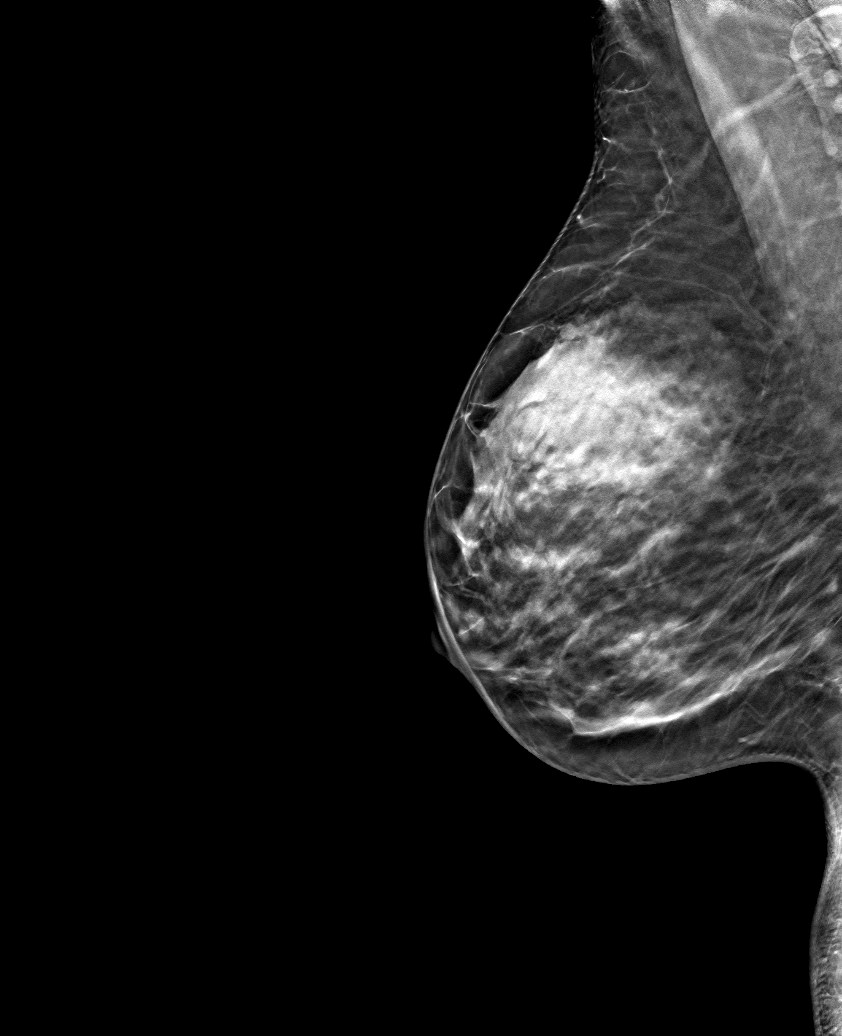

[6 of 30 positions shown; findings below may reference images not displayed]

ACR Breast Density Category c: The breast tissue is heterogeneously
dense, which may obscure small masses.
FINDINGS: There are no findings suspicious for malignancy.
IMPRESSION: No mammographic evidence of malignancy. A result letter of this
screening mammogram will be mailed directly to the patient.

RECOMMENDATION:
Screening mammogram in one year. (Code:Q3-W-BC3)

BI-RADS CATEGORY  1: Negative.

## 2023-03-25 ENCOUNTER — Encounter (INDEPENDENT_AMBULATORY_CARE_PROVIDER_SITE_OTHER): Payer: Self-pay | Admitting: Nurse Practitioner

## 2023-03-25 ENCOUNTER — Ambulatory Visit (INDEPENDENT_AMBULATORY_CARE_PROVIDER_SITE_OTHER): Payer: BC Managed Care – PPO | Admitting: Nurse Practitioner

## 2023-03-25 VITALS — BP 134/84 | HR 103 | Resp 16 | Ht 62.0 in | Wt 182.2 lb

## 2023-03-25 DIAGNOSIS — I83813 Varicose veins of bilateral lower extremities with pain: Secondary | ICD-10-CM

## 2023-03-25 NOTE — Progress Notes (Signed)
Varicose veins of bilateral  lower extremity with inflammation (454.1  I83.10) Current Plans   Indication: Patient presents with symptomatic varicose veins of the bilateral  lower extremity.   Procedure: Sclerotherapy using hypertonic saline mixed with 1% Lidocaine was performed on the bilateral lower extremity. Compression wraps were placed. The patient tolerated the procedure well. 

## 2023-04-20 ENCOUNTER — Encounter (INDEPENDENT_AMBULATORY_CARE_PROVIDER_SITE_OTHER): Payer: Self-pay

## 2023-04-22 ENCOUNTER — Ambulatory Visit (INDEPENDENT_AMBULATORY_CARE_PROVIDER_SITE_OTHER): Payer: BC Managed Care – PPO | Admitting: Nurse Practitioner

## 2023-05-20 ENCOUNTER — Ambulatory Visit (INDEPENDENT_AMBULATORY_CARE_PROVIDER_SITE_OTHER): Payer: BC Managed Care – PPO | Admitting: Nurse Practitioner

## 2023-06-21 ENCOUNTER — Ambulatory Visit (INDEPENDENT_AMBULATORY_CARE_PROVIDER_SITE_OTHER): Payer: BC Managed Care – PPO | Admitting: Nurse Practitioner

## 2023-06-25 ENCOUNTER — Telehealth: Payer: BC Managed Care – PPO | Admitting: Physician Assistant

## 2023-06-25 DIAGNOSIS — B9689 Other specified bacterial agents as the cause of diseases classified elsewhere: Secondary | ICD-10-CM | POA: Diagnosis not present

## 2023-06-25 DIAGNOSIS — N760 Acute vaginitis: Secondary | ICD-10-CM

## 2023-06-25 MED ORDER — METRONIDAZOLE 500 MG PO TABS: 500.0000 mg | ORAL_TABLET | Freq: Two times a day (BID) | ORAL | 0 refills | Status: AC

## 2023-06-25 NOTE — Progress Notes (Signed)
E-Visit for Vaginal Symptoms  We are sorry that you are not feeling well. Here is how we plan to help! Based on what you shared with me it looks like you: May have a vaginosis due to bacteria  Vaginosis is an inflammation of the vagina that can result in discharge, itching and pain. The cause is usually a change in the normal balance of vaginal bacteria or an infection. Vaginosis can also result from reduced estrogen levels after menopause.  The most common causes of vaginosis are:   Bacterial vaginosis which results from an overgrowth of one on several organisms that are normally present in your vagina.   Yeast infections which are caused by a naturally occurring fungus called candida.   Vaginal atrophy (atrophic vaginosis) which results from the thinning of the vagina from reduced estrogen levels after menopause.   Trichomoniasis which is caused by a parasite and is commonly transmitted by sexual intercourse.  Factors that increase your risk of developing vaginosis include: Medications, such as antibiotics and steroids Uncontrolled diabetes Use of hygiene products such as bubble bath, vaginal spray or vaginal deodorant Douching Wearing damp or tight-fitting clothing Using an intrauterine device (IUD) for birth control Hormonal changes, such as those associated with pregnancy, birth control pills or menopause Sexual activity Having a sexually transmitted infection  Your treatment plan is Metronidazole or Flagyl 500mg twice a day for 7 days.  I have electronically sent this prescription into the pharmacy that you have chosen.  Be sure to take all of the medication as directed. Stop taking any medication if you develop a rash, tongue swelling or shortness of breath. Mothers who are breast feeding should consider pumping and discarding their breast milk while on these antibiotics. However, there is no consensus that infant exposure at these doses would be harmful.  Remember that  medication creams can weaken latex condoms. .   HOME CARE:  Good hygiene may prevent some types of vaginosis from recurring and may relieve some symptoms:  Avoid baths, hot tubs and whirlpool spas. Rinse soap from your outer genital area after a shower, and dry the area well to prevent irritation. Don't use scented or harsh soaps, such as those with deodorant or antibacterial action. Avoid irritants. These include scented tampons and pads. Wipe from front to back after using the toilet. Doing so avoids spreading fecal bacteria to your vagina.  Other things that may help prevent vaginosis include:  Don't douche. Your vagina doesn't require cleansing other than normal bathing. Repetitive douching disrupts the normal organisms that reside in the vagina and can actually increase your risk of vaginal infection. Douching won't clear up a vaginal infection. Use a latex condom. Both female and female latex condoms may help you avoid infections spread by sexual contact. Wear cotton underwear. Also wear pantyhose with a cotton crotch. If you feel comfortable without it, skip wearing underwear to bed. Yeast thrives in moist environments Your symptoms should improve in the next day or two.  GET HELP RIGHT AWAY IF:  You have pain in your lower abdomen ( pelvic area or over your ovaries) You develop nausea or vomiting You develop a fever Your discharge changes or worsens You have persistent pain with intercourse You develop shortness of breath, a rapid pulse, or you faint.  These symptoms could be signs of problems or infections that need to be evaluated by a medical provider now.  MAKE SURE YOU   Understand these instructions. Will watch your condition. Will get help right   away if you are not doing well or get worse.  Thank you for choosing an e-visit.  Your e-visit answers were reviewed by a board certified advanced clinical practitioner to complete your personal care plan. Depending upon the  condition, your plan could have included both over the counter or prescription medications.  Please review your pharmacy choice. Make sure the pharmacy is open so you can pick up prescription now. If there is a problem, you may contact your provider through MyChart messaging and have the prescription routed to another pharmacy.  Your safety is important to us. If you have drug allergies check your prescription carefully.   For the next 24 hours you can use MyChart to ask questions about today's visit, request a non-urgent call back, or ask for a work or school excuse. You will get an email in the next two days asking about your experience. I hope that your e-visit has been valuable and will speed your recovery.  I have spent 5 minutes in review of e-visit questionnaire, review and updating patient chart, medical decision making and response to patient.   Lollie Gunner M Hudsyn Barich, PA-C  

## 2024-02-03 ENCOUNTER — Ambulatory Visit (INDEPENDENT_AMBULATORY_CARE_PROVIDER_SITE_OTHER): Payer: BC Managed Care – PPO | Admitting: Family Medicine

## 2024-02-03 ENCOUNTER — Encounter: Payer: Self-pay | Admitting: Family Medicine

## 2024-02-03 VITALS — BP 126/80 | HR 80 | Temp 98.7°F | Resp 17 | Ht 62.01 in | Wt 170.2 lb

## 2024-02-03 DIAGNOSIS — Z1231 Encounter for screening mammogram for malignant neoplasm of breast: Secondary | ICD-10-CM

## 2024-02-03 DIAGNOSIS — K219 Gastro-esophageal reflux disease without esophagitis: Secondary | ICD-10-CM | POA: Diagnosis not present

## 2024-02-03 DIAGNOSIS — R002 Palpitations: Secondary | ICD-10-CM

## 2024-02-03 DIAGNOSIS — M109 Gout, unspecified: Secondary | ICD-10-CM | POA: Diagnosis not present

## 2024-02-03 DIAGNOSIS — R232 Flushing: Secondary | ICD-10-CM | POA: Diagnosis not present

## 2024-02-03 DIAGNOSIS — Z0000 Encounter for general adult medical examination without abnormal findings: Secondary | ICD-10-CM

## 2024-02-03 MED ORDER — PANTOPRAZOLE SODIUM 40 MG PO TBEC: DELAYED_RELEASE_TABLET | ORAL | 3 refills | Status: AC

## 2024-02-03 NOTE — Assessment & Plan Note (Signed)
 Rechecking labs today. Await results. Treat as needed.

## 2024-02-03 NOTE — Progress Notes (Signed)
 BP 126/80 (BP Location: Left Arm, Patient Position: Sitting, Cuff Size: Large)   Pulse 80   Temp 98.7 F (37.1 C) (Oral)   Resp 17   Ht 5' 2.01" (1.575 m)   Wt 170 lb 3.2 oz (77.2 kg)   LMP  (LMP Unknown)   SpO2 99%   BMI 31.12 kg/m    Subjective:    Patient ID: Stacy Phelps, female    DOB: [birth year], 53 y.o.   MRN: [id number]  HPI: Stacy Phelps is a 53 y.o. female presenting on 02/03/2024 for comprehensive medical examination. Current medical complaints include:  PALPITATIONS Duration: a year, but significantly worse in the past month or so Symptom description: fluttering, especially at night Duration of episode: with laying on her L side and better with movement Frequency: 4/5 nights Activity when event occurred: with rest Related to exertion: no Dyspnea: no Chest pain: no Syncope: no Anxiety/stress: no Nausea/vomiting: no Diaphoresis: yes Coronary artery disease: no Congestive heart failure: no Arrhythmia:no Thyroid disease: no Caffeine intake: none Status:  worse Treatments attempted:cutting out carbonated drinks and soft drinks  GERD GERD control status: controlled Satisfied with current treatment? yes Heartburn frequency: in the AM Medication side effects: no  Medication compliance: excellent Dysphagia: no Odynophagia:  no Hematemesis: no Blood in stool: no EGD: no  She currently lives with: Menopausal Symptoms: yes  Depression Screen done today and results listed below:     02/03/2024    4:05 PM 02/02/2023    4:20 PM 11/20/2022   10:24 AM 12/26/2021    3:49 PM 12/23/2020    4:16 PM  Depression screen PHQ 2/9  Decreased Interest 0 0 0 0 0  Down, Depressed, Hopeless 0 0 0 0 0  PHQ - 2 Score 0 0 0 0 0  Altered sleeping  0 0 0   Tired, decreased energy  0 0 0   Change in appetite  0 0 0   Feeling bad or failure about yourself   0 0 0   Trouble concentrating  0 0 0   Moving slowly or fidgety/restless  0 0 0   Suicidal thoughts  0 0 0   PHQ-9  Score  0 0 0   Difficult doing work/chores   Not difficult at all      Past Medical History:  Past Medical History:  Diagnosis Date   Allergy    Anxiety    Arthritis October 2021   2 steroid injections   Depression    GERD (gastroesophageal reflux disease) November   Doesn't seem to be related to any specific food   Gout    Gout    Obesity     Surgical History:  Past Surgical History:  Procedure Laterality Date   CHOLECYSTECTOMY     COLONOSCOPY WITH PROPOFOL N/A 03/11/2021   Procedure: COLONOSCOPY WITH PROPOFOL;  Surgeon: Wyline Mood, MD;  Location: [facility] ENDOSCOPY;  Service: Gastroenterology;  Laterality: N/A;   TOTAL ABDOMINAL HYSTERECTOMY      Medications:  Current Outpatient Medications on File Prior to Visit  Medication Sig   fluticasone (FLONASE) 50 MCG/ACT nasal spray Place into both nostrils daily.   No current facility-administered medications on file prior to visit.    Allergies:  Allergies  Allergen Reactions   Acetaminophen Nausea And Vomiting    Social History:  Social History   Socioeconomic History   Marital status: Married    Spouse name: Molly Maduro   Number of children: 1   Years  of education: Not on file   Highest education level: Bachelor's degree (e.g., BA, AB, BS)  Occupational History   Not on file  Tobacco Use   Smoking status: Never    Passive exposure: Never   Smokeless tobacco: Never  Vaping Use   Vaping status: Never Used  Substance and Sexual Activity   Alcohol use: Not Currently   Drug use: Never   Sexual activity: Yes    Birth control/protection: Surgical  Other Topics Concern   Not on file  Social History Narrative   Not on file   Social Drivers of Health   Financial Resource Strain: Low Risk  (02/01/2024)   Overall Financial Resource Strain (CARDIA)    Difficulty of Paying Living Expenses: Not hard at all  Food Insecurity: No Food Insecurity (02/01/2024)   Hunger Vital Sign    Worried About Running Out of Food in the  Last Year: Never true    Ran Out of Food in the Last Year: Never true  Transportation Needs: No Transportation Needs (02/01/2024)   PRAPARE - Administrator, Civil Service (Medical): No    Lack of Transportation (Non-Medical): No  Physical Activity: Insufficiently Active (02/01/2024)   Exercise Vital Sign    Days of Exercise per Week: 1 day    Minutes of Exercise per Session: 10 min  Stress: No Stress Concern Present (02/01/2024)   [employer] of Occupational Health - Occupational Stress Questionnaire    Feeling of Stress : Only a little  Social Connections: Unknown (02/01/2024)   Social Connection and Isolation Panel [NHANES]    Frequency of Communication with Friends and Family: Three times a week    Frequency of Social Gatherings with Friends and Family: Once a week    Attends Religious Services: Patient declined    Active Member of Clubs or Organizations: Yes    Attends Engineer, structural: More than 4 times per year    Marital Status: Married  Catering manager Violence: Not At Risk (02/03/2024)   Humiliation, Afraid, Rape, and Kick questionnaire    Fear of Current or Ex-Partner: No    Emotionally Abused: No    Physically Abused: No    Sexually Abused: No   Social History   Tobacco Use  Smoking Status Never   Passive exposure: Never  Smokeless Tobacco Never   Social History   Substance and Sexual Activity  Alcohol Use Not Currently    Family History:  Family History  Problem Relation Age of Onset   Diabetes Mother    Thyroid disease Mother    Hypertension Father    Heart disease Father    Diabetes Father    Arthritis Father    Hearing loss Father    Diabetes Maternal Grandmother    Hypertension Paternal Grandmother    Stroke Paternal Grandmother    Varicose Veins Paternal Grandmother    Hypertension Paternal Grandfather    Cancer Paternal Grandfather        bone   Stroke Paternal Grandfather    Anxiety disorder Sister    Breast  cancer Neg Hx     Past medical history, surgical history, medications, allergies, family history and social history reviewed with patient today and changes made to appropriate areas of the chart.   Review of Systems  Constitutional:  Positive for diaphoresis and weight loss. Negative for chills, fever and malaise/fatigue.  HENT: Negative.         Itchy  Eyes:  Positive for blurred vision.  Negative for double vision, photophobia, pain, discharge and redness.  Respiratory: Negative.    Cardiovascular:  Positive for palpitations. Negative for chest pain, orthopnea, claudication, leg swelling and PND.  Gastrointestinal:  Positive for heartburn and nausea. Negative for abdominal pain, blood in stool, constipation, diarrhea, melena and vomiting.       + bloated  Genitourinary: Negative.   Musculoskeletal: Negative.   Skin: Negative.   Neurological: Negative.   Endo/Heme/Allergies:  Positive for environmental allergies. Negative for polydipsia. Does not bruise/bleed easily.  Psychiatric/Behavioral: Negative.     All other ROS negative except what is listed above and in the HPI.      Objective:    BP 126/80 (BP Location: Left Arm, Patient Position: Sitting, Cuff Size: Large)   Pulse 80   Temp 98.7 F (37.1 C) (Oral)   Resp 17   Ht 5' 2.01" (1.575 m)   Wt 170 lb 3.2 oz (77.2 kg)   LMP  (LMP Unknown)   SpO2 99%   BMI 31.12 kg/m   Wt Readings from Last 3 Encounters:  02/03/24 170 lb 3.2 oz (77.2 kg)  03/25/23 182 lb 3.2 oz (82.6 kg)  02/04/23 175 lb 6.4 oz (79.6 kg)    Physical Exam Vitals and nursing note reviewed.  Constitutional:      General: She is not in acute distress.    Appearance: Normal appearance. She is not ill-appearing, toxic-appearing or diaphoretic.  HENT:     Head: Normocephalic and atraumatic.     Right Ear: Tympanic membrane, ear canal and external ear normal. There is no impacted cerumen.     Left Ear: Tympanic membrane, ear canal and external ear normal.  There is no impacted cerumen.     Nose: Nose normal. No congestion or rhinorrhea.     Mouth/Throat:     Mouth: Mucous membranes are moist.     Pharynx: Oropharynx is clear. No oropharyngeal exudate or posterior oropharyngeal erythema.  Eyes:     General: No scleral icterus.       Right eye: No discharge.        Left eye: No discharge.     Extraocular Movements: Extraocular movements intact.     Conjunctiva/sclera: Conjunctivae normal.     Pupils: Pupils are equal, round, and reactive to light.  Neck:     Vascular: No carotid bruit.  Cardiovascular:     Rate and Rhythm: Normal rate and regular rhythm.     Pulses: Normal pulses.     Heart sounds: No murmur heard.    No friction rub. No gallop.  Pulmonary:     Effort: Pulmonary effort is normal. No respiratory distress.     Breath sounds: Normal breath sounds. No stridor. No wheezing, rhonchi or rales.  Chest:     Chest wall: No tenderness.  Abdominal:     General: Abdomen is flat. Bowel sounds are normal. There is no distension.     Palpations: Abdomen is soft. There is no mass.     Tenderness: There is no abdominal tenderness. There is no right CVA tenderness, left CVA tenderness, guarding or rebound.     Hernia: No hernia is present.  Genitourinary:    Comments: Breast and pelvic exams deferred with shared decision making Musculoskeletal:        General: No swelling, tenderness, deformity or signs of injury.     Cervical back: Normal range of motion and neck supple. No rigidity. No muscular tenderness.     Right lower leg:  No edema.     Left lower leg: No edema.  Lymphadenopathy:     Cervical: No cervical adenopathy.  Skin:    General: Skin is warm and dry.     Capillary Refill: Capillary refill takes less than 2 seconds.     Coloration: Skin is not jaundiced or pale.     Findings: No bruising, erythema, lesion or rash.  Neurological:     General: No focal deficit present.     Mental Status: She is alert and oriented to  person, place, and time. Mental status is at baseline.     Cranial Nerves: No cranial nerve deficit.     Sensory: No sensory deficit.     Motor: No weakness.     Coordination: Coordination normal.     Gait: Gait normal.     Deep Tendon Reflexes: Reflexes normal.  Psychiatric:        Mood and Affect: Mood normal.        Behavior: Behavior normal.        Thought Content: Thought content normal.        Judgment: Judgment normal.     Results for orders placed or performed in visit on 02/02/23  Urinalysis, Routine w reflex microscopic   Collection Time: 02/02/23  4:24 PM  Result Value Ref Range   Specific Gravity, UA <1.005 (L) 1.005 - 1.030   pH, UA 6.0 5.0 - 7.5   Color, UA Yellow Yellow   Appearance Ur Clear Clear   Leukocytes,UA Negative Negative   Protein,UA Negative Negative/Trace   Glucose, UA Negative Negative   Ketones, UA Negative Negative   RBC, UA Negative Negative   Bilirubin, UA Negative Negative   Urobilinogen, Ur 0.2 0.2 - 1.0 mg/dL   Nitrite, UA Negative Negative   Microscopic Examination Comment   CBC with Differential/Platelet   Collection Time: 02/02/23  4:26 PM  Result Value Ref Range   WBC 6.0 3.4 - 10.8 x10E3/uL   RBC 4.29 3.77 - 5.28 x10E6/uL   Hemoglobin 12.9 11.1 - 15.9 g/dL   Hematocrit 47.8 29.5 - 46.6 %   MCV 88 79 - 97 fL   MCH 30.1 26.6 - 33.0 pg   MCHC 34.2 31.5 - 35.7 g/dL   RDW 62.1 30.8 - 65.7 %   Platelets 257 150 - 450 x10E3/uL   Neutrophils 64 Not Estab. %   Lymphs 25 Not Estab. %   Monocytes 8 Not Estab. %   Eos 2 Not Estab. %   Basos 1 Not Estab. %   Neutrophils Absolute 3.8 1.4 - 7.0 x10E3/uL   Lymphocytes Absolute 1.5 0.7 - 3.1 x10E3/uL   Monocytes Absolute 0.5 0.1 - 0.9 x10E3/uL   EOS (ABSOLUTE) 0.1 0.0 - 0.4 x10E3/uL   Basophils Absolute 0.0 0.0 - 0.2 x10E3/uL   Immature Granulocytes 0 Not Estab. %   Immature Grans (Abs) 0.0 0.0 - 0.1 x10E3/uL  Comprehensive metabolic panel   Collection Time: 02/02/23  4:26 PM  Result  Value Ref Range   Glucose 85 70 - 99 mg/dL   BUN 12 6 - 24 mg/dL   Creatinine, Ser 8.46 0.57 - 1.00 mg/dL   eGFR 962 >95 MW/UXL/2.44   BUN/Creatinine Ratio 18 9 - 23   Sodium 139 134 - 144 mmol/L   Potassium 3.6 3.5 - 5.2 mmol/L   Chloride 99 96 - 106 mmol/L   CO2 24 20 - 29 mmol/L   Calcium 9.7 8.7 - 10.2 mg/dL   Total Protein  7.6 6.0 - 8.5 g/dL   Albumin 4.8 3.8 - 4.9 g/dL   Globulin, Total 2.8 1.5 - 4.5 g/dL   Albumin/Globulin Ratio 1.7 1.2 - 2.2   Bilirubin Total 0.6 0.0 - 1.2 mg/dL   Alkaline Phosphatase 93 44 - 121 IU/L   AST 19 0 - 40 IU/L   ALT 21 0 - 32 IU/L  Lipid Panel w/o Chol/HDL Ratio   Collection Time: 02/02/23  4:26 PM  Result Value Ref Range   Cholesterol, Total 192 100 - 199 mg/dL   Triglycerides 161 0 - 149 mg/dL   HDL 56 >09 mg/dL   VLDL Cholesterol Cal 21 5 - 40 mg/dL   LDL Chol Calc (NIH) 604 (H) 0 - 99 mg/dL  TSH   Collection Time: 02/02/23  4:26 PM  Result Value Ref Range   TSH 2.010 0.450 - 4.500 uIU/mL  Uric acid   Collection Time: 02/02/23  4:26 PM  Result Value Ref Range   Uric Acid 4.1 3.0 - 7.2 mg/dL      Assessment & Plan:   Problem List Items Addressed This Visit       Digestive   GERD (gastroesophageal reflux disease)   Under good control on current regimen. Continue current regimen. Continue to monitor. Call with any concerns. Refills given. Labs drawn today.       Relevant Medications   pantoprazole (PROTONIX) 40 MG tablet     Other   Gout   Rechecking labs today. Await results. Treat as needed.       Relevant Orders   Uric acid   Other Visit Diagnoses       Routine general medical examination at a health care facility    -  Primary   Vaccines up to date/declined. Screening labs checked today. Pap N/A. Mammo ordered. Colonoscopy up to date. Continue diet and exercise. Call with any concerns.   Relevant Orders   CBC with Differential/Platelet   Comprehensive metabolic panel   Lipid Panel w/o Chol/HDL Ratio   TSH      Palpitations       Will check zio monitor. Check labs. Await results- treat as needed.   Relevant Orders   LONG TERM MONITOR (3-14 DAYS)   Ferritin     Hot flashes       Will check labs. Try OTC meds. Recheck 4-6 weeks. Call with any concerns.     Encounter for screening mammogram for malignant neoplasm of breast       Mammo ordered today.   Relevant Orders   MM 3D SCREENING MAMMOGRAM BILATERAL BREAST        Follow up plan: Return 4-6 weeks.   LABORATORY TESTING:  - Pap smear: not applicable  IMMUNIZATIONS:   - Tdap: Tetanus vaccination status reviewed: last tetanus booster within 10 years. - Influenza: Refused - Pneumovax: Not applicable - Prevnar: Not applicable - COVID: Refused - HPV: Refused - Shingrix vaccine: Refused  SCREENING: -Mammogram: Ordered today  - Colonoscopy: Up to date   PATIENT COUNSELING:   Advised to take 1 mg of folate supplement per day if capable of pregnancy.   Sexuality: Discussed sexually transmitted diseases, partner selection, use of condoms, avoidance of unintended pregnancy  and contraceptive alternatives.   Advised to avoid cigarette smoking.  I discussed with the patient that most people either abstain from alcohol or drink within safe limits (<=14/week and <=4 drinks/occasion for males, <=7/weeks and <= 3 drinks/occasion for females) and that the risk for alcohol  disorders and other health effects rises proportionally with the number of drinks per week and how often a drinker exceeds daily limits.  Discussed cessation/primary prevention of drug use and availability of treatment for abuse.   Diet: Encouraged to adjust caloric intake to maintain  or achieve ideal body weight, to reduce intake of dietary saturated fat and total fat, to limit sodium intake by avoiding high sodium foods and not adding table salt, and to maintain adequate dietary potassium and calcium preferably from fresh fruits, vegetables, and low-fat dairy products.     stressed the importance of regular exercise  Injury prevention: Discussed safety belts, safety helmets, smoke detector, smoking near bedding or upholstery.   Dental health: Discussed importance of regular tooth brushing, flossing, and dental visits.    NEXT PREVENTATIVE PHYSICAL DUE IN 1 YEAR. Return 4-6 weeks.

## 2024-02-03 NOTE — Patient Instructions (Addendum)
 [place]  Evening Primrose Oil   You have an order for:  []   2D Mammogram  [x]   3D Mammogram  []   Bone Density     Please call for appointment:  [facility]  [address] [place] [address] [phone] [facility] [address] [place], [place] [address] [phone] [facility] [address] [place] [place], [place] [address] [phone]   Make sure to wear two-piece clothing.  No lotions, powders, or deodorants the day of the appointment. Make sure to bring picture ID and insurance card.  Bring list of medications you are currently taking including any supplements.   Schedule your [facility] screening mammogram through MyChart!   Log into your MyChart account.  Go to 'Visit' (or 'Appointments' if on mobile App) --> Schedule an Appointment  Under 'Select a Reason for Visit' choose the Mammogram Screening option.  Complete the pre-visit questions and select the time and place that best fits your schedule.

## 2024-02-03 NOTE — Assessment & Plan Note (Signed)
 Under good control on current regimen. Continue current regimen. Continue to monitor. Call with any concerns. Refills given. Labs drawn today.

## 2024-02-04 ENCOUNTER — Encounter: Payer: Self-pay | Admitting: Family Medicine

## 2024-02-04 ENCOUNTER — Ambulatory Visit: Attending: Family Medicine

## 2024-02-04 ENCOUNTER — Other Ambulatory Visit: Payer: Self-pay | Admitting: Family Medicine

## 2024-02-04 ENCOUNTER — Ambulatory Visit: Payer: Self-pay | Admitting: Family Medicine

## 2024-02-04 DIAGNOSIS — R002 Palpitations: Secondary | ICD-10-CM

## 2024-02-04 LAB — CBC WITH DIFFERENTIAL/PLATELET
Basophils Absolute: 0 10*3/uL (ref 0.0–0.2)
Basos: 1 %
EOS (ABSOLUTE): 0.1 10*3/uL (ref 0.0–0.4)
Eos: 2 %
Hematocrit: 39.9 % (ref 34.0–46.6)
Hemoglobin: 13.2 g/dL (ref 11.1–15.9)
Immature Grans (Abs): 0 10*3/uL (ref 0.0–0.1)
Immature Granulocytes: 0 %
Lymphocytes Absolute: 1.5 10*3/uL (ref 0.7–3.1)
Lymphs: 28 %
MCH: 29.5 pg (ref 26.6–33.0)
MCHC: 33.1 g/dL (ref 31.5–35.7)
MCV: 89 fL (ref 79–97)
Monocytes Absolute: 0.5 10*3/uL (ref 0.1–0.9)
Monocytes: 8 %
Neutrophils Absolute: 3.3 10*3/uL (ref 1.4–7.0)
Neutrophils: 61 %
Platelets: 273 10*3/uL (ref 150–450)
RBC: 4.47 x10E6/uL (ref 3.77–5.28)
RDW: 11.8 % (ref 11.7–15.4)
WBC: 5.4 10*3/uL (ref 3.4–10.8)

## 2024-02-04 LAB — COMPREHENSIVE METABOLIC PANEL
ALT: 18 IU/L (ref 0–32)
AST: 18 IU/L (ref 0–40)
Albumin: 4.6 g/dL (ref 3.8–4.9)
Alkaline Phosphatase: 104 IU/L (ref 44–121)
BUN/Creatinine Ratio: 19 (ref 9–23)
BUN: 14 mg/dL (ref 6–24)
Bilirubin Total: 0.4 mg/dL (ref 0.0–1.2)
CO2: 25 mmol/L (ref 20–29)
Calcium: 9.5 mg/dL (ref 8.7–10.2)
Chloride: 98 mmol/L (ref 96–106)
Creatinine, Ser: 0.74 mg/dL (ref 0.57–1.00)
Globulin, Total: 3 g/dL (ref 1.5–4.5)
Glucose: 86 mg/dL (ref 70–99)
Potassium: 3.9 mmol/L (ref 3.5–5.2)
Sodium: 139 mmol/L (ref 134–144)
Total Protein: 7.6 g/dL (ref 6.0–8.5)
eGFR: 97 mL/min/{1.73_m2} (ref >59)

## 2024-02-04 LAB — FERRITIN: Ferritin: 197 ng/mL — ABNORMAL HIGH (ref 15–150)

## 2024-02-04 LAB — LIPID PANEL W/O CHOL/HDL RATIO
Cholesterol, Total: 220 mg/dL — ABNORMAL HIGH (ref 100–199)
HDL: 58 mg/dL (ref >39)
LDL Chol Calc (NIH): 148 mg/dL — ABNORMAL HIGH (ref 0–99)
Triglycerides: 80 mg/dL (ref 0–149)
VLDL Cholesterol Cal: 14 mg/dL (ref 5–40)

## 2024-02-04 LAB — URIC ACID: Uric Acid: 4.5 mg/dL (ref 3.0–7.2)

## 2024-02-04 LAB — TSH: TSH: 2.15 u[IU]/mL (ref 0.450–4.500)

## 2024-02-04 MED ORDER — IRON (FERROUS SULFATE) 325 (65 FE) MG PO TABS: 325.0000 mg | ORAL_TABLET | Freq: Every day | ORAL | 1 refills | Status: DC

## 2024-02-04 NOTE — Telephone Encounter (Signed)
 See mychart discussion between provider and patient.

## 2024-02-04 NOTE — Telephone Encounter (Signed)
 Pt calls in with questions about lab results from yesterday.  Disposition: [x]  Follow-up with PCP  Additional Notes: Pt states she had a physical yesterday and got blood work done. Pt states she received a message from Dr. Laural Benes as follows: "Everything looks nice and normal for the most part. Your cholesterol went up a little bit, but not enough where you need to have any medicine right now- we'll just keep an eye on it. Your ferritin is also a little high which can suggest some iron deficiency and can cause the restless legs. I'm going to send you in an iron supplement and we'll recheck it next time I see you. Have a great day!" Pt has questions about why she needs to take an iron supplement if the ferritin is high. This RN contacted [facility] and clinical staff said provider will reach out to pt when she gets out of a pt's room. Pt states understanding.  Copied from CRM [id number]. Topic: Clinical - Lab/Test Results >> Feb 04, 2024  4:13 PM Franchot Heidelberg wrote: Reason for CRM: Question about labs and medications Reason for Disposition . [1] Caller requesting NON-URGENT health information AND [2] PCP's office is the best resource  Answer Assessment - Initial Assessment Questions 1. REASON FOR CALL or QUESTION: "What is your reason for calling today?" or "How can I best help you?" or "What question do you have that I can help answer?"     Lab results  Protocols used: Information Only Call - No Triage-A-AH

## 2024-03-04 DIAGNOSIS — R002 Palpitations: Secondary | ICD-10-CM | POA: Diagnosis not present

## 2024-03-06 ENCOUNTER — Ambulatory Visit
Admission: RE | Admit: 2024-03-06 | Discharge: 2024-03-06 | Disposition: A | Discharge status: Home / Self Care | Source: Ambulatory Visit | Attending: Family Medicine | Admitting: Family Medicine

## 2024-03-06 DIAGNOSIS — Z1231 Encounter for screening mammogram for malignant neoplasm of breast: Secondary | ICD-10-CM | POA: Diagnosis present

## 2024-03-10 ENCOUNTER — Encounter: Payer: Self-pay | Admitting: Family Medicine

## 2024-03-17 ENCOUNTER — Encounter: Payer: Self-pay | Admitting: Family Medicine

## 2024-03-17 ENCOUNTER — Ambulatory Visit: Admitting: Family Medicine

## 2024-03-17 VITALS — BP 139/82 | HR 82 | Ht 62.0 in | Wt 178.2 lb

## 2024-03-17 DIAGNOSIS — R232 Flushing: Secondary | ICD-10-CM | POA: Diagnosis not present

## 2024-03-17 DIAGNOSIS — Z6832 Body mass index (BMI) 32.0-32.9, adult: Secondary | ICD-10-CM

## 2024-03-17 DIAGNOSIS — K219 Gastro-esophageal reflux disease without esophagitis: Secondary | ICD-10-CM | POA: Diagnosis not present

## 2024-03-17 DIAGNOSIS — E66811 Obesity, class 1: Secondary | ICD-10-CM

## 2024-03-17 DIAGNOSIS — E6609 Other obesity due to excess calories: Secondary | ICD-10-CM

## 2024-03-17 DIAGNOSIS — R002 Palpitations: Secondary | ICD-10-CM

## 2024-03-17 NOTE — Progress Notes (Signed)
 BP 139/82   Pulse 82   Ht 5\' 2"  (1.575 m)   Wt 178 lb 3.2 oz (80.8 kg)   LMP  (LMP Unknown)   SpO2 98%   BMI 32.59 kg/m    Subjective:    Patient ID: Stacy Phelps, female    DOB: August 09, 1971, 53 y.o.   MRN: [id number]  HPI: Stacy Phelps is a 53 y.o. female  Chief Complaint  Patient presents with   Hot Flashes   FOLLOW UP  ZIO PATCH   GERD GERD control status:  fluctating- doing well on the protonix , but with breakthroughs Satisfied with current treatment? no Heartburn frequency: occasioally Medication side effects: no  Medication compliance: good Dysphagia: no Odynophagia:  no Hematemesis: no Blood in stool: no EGD: yes  MENOPAUSAL SYMPTOMS Duration: months- worsening Status: worse Symptom severity: severe Hot flashes: yes Night sweats: yes Sleep disturbances: yes Vaginal dryness: no Dyspareunia:no Decreased libido: no Emotional lability: yes Stress incontinence: no Previous HRT/pharmacotherapy: no Hysterectomy: yes Absolute Contraindications to Hormonal Therapy:     Undiagnosed vaginal bleeding: no    Breast cancer: no    Endometrial cancer: no    Coronary disease: no    Cerebrovascular disease: no    Venous thromboembolic disease: no   Relevant past medical, surgical, family and social history reviewed and updated as indicated. Interim medical history since our last visit reviewed. Allergies and medications reviewed and updated.  Review of Systems  Constitutional: Negative.   Respiratory: Negative.    Cardiovascular: Negative.   Gastrointestinal: Negative.   Musculoskeletal: Negative.   Neurological: Negative.   Psychiatric/Behavioral: Negative.      Per HPI unless specifically indicated above     Objective:    BP 139/82   Pulse 82   Ht 5\' 2"  (1.575 m)   Wt 178 lb 3.2 oz (80.8 kg)   LMP  (LMP Unknown)   SpO2 98%   BMI 32.59 kg/m   Wt Readings from Last 3 Encounters:  03/17/24 178 lb 3.2 oz (80.8 kg)  02/03/24 170 lb 3.2 oz (77.2  kg)  03/25/23 182 lb 3.2 oz (82.6 kg)    Physical Exam Vitals and nursing note reviewed.  Constitutional:      General: She is not in acute distress.    Appearance: Normal appearance. She is not ill-appearing, toxic-appearing or diaphoretic.  HENT:     Head: Normocephalic and atraumatic.     Right Ear: External ear normal.     Left Ear: External ear normal.     Nose: Nose normal.     Mouth/Throat:     Mouth: Mucous membranes are moist.     Pharynx: Oropharynx is clear.  Eyes:     General: No scleral icterus.       Right eye: No discharge.        Left eye: No discharge.     Extraocular Movements: Extraocular movements intact.     Conjunctiva/sclera: Conjunctivae normal.     Pupils: Pupils are equal, round, and reactive to light.  Cardiovascular:     Rate and Rhythm: Normal rate and regular rhythm.     Pulses: Normal pulses.     Heart sounds: Normal heart sounds. No murmur heard.    No friction rub. No gallop.  Pulmonary:     Effort: Pulmonary effort is normal. No respiratory distress.     Breath sounds: Normal breath sounds. No stridor. No wheezing, rhonchi or rales.  Chest:     Chest wall:  No tenderness.  Musculoskeletal:        General: Normal range of motion.     Cervical back: Normal range of motion and neck supple.  Skin:    General: Skin is warm and dry.     Capillary Refill: Capillary refill takes less than 2 seconds.     Coloration: Skin is not jaundiced or pale.     Findings: No bruising, erythema, lesion or rash.  Neurological:     General: No focal deficit present.     Mental Status: She is alert and oriented to person, place, and time. Mental status is at baseline.  Psychiatric:        Mood and Affect: Mood normal.        Behavior: Behavior normal.        Thought Content: Thought content normal.        Judgment: Judgment normal.     Results for orders placed or performed in visit on 02/03/24  CBC with Differential/Platelet   Collection Time: 02/03/24   4:37 PM  Result Value Ref Range   WBC 5.4 3.4 - 10.8 x10E3/uL   RBC 4.47 3.77 - 5.28 x10E6/uL   Hemoglobin 13.2 11.1 - 15.9 g/dL   Hematocrit 16.1 09.6 - 46.6 %   MCV 89 79 - 97 fL   MCH 29.5 26.6 - 33.0 pg   MCHC 33.1 31.5 - 35.7 g/dL   RDW 04.5 40.9 - 81.1 %   Platelets 273 150 - 450 x10E3/uL   Neutrophils 61 Not Estab. %   Lymphs 28 Not Estab. %   Monocytes 8 Not Estab. %   Eos 2 Not Estab. %   Basos 1 Not Estab. %   Neutrophils Absolute 3.3 1.4 - 7.0 x10E3/uL   Lymphocytes Absolute 1.5 0.7 - 3.1 x10E3/uL   Monocytes Absolute 0.5 0.1 - 0.9 x10E3/uL   EOS (ABSOLUTE) 0.1 0.0 - 0.4 x10E3/uL   Basophils Absolute 0.0 0.0 - 0.2 x10E3/uL   Immature Granulocytes 0 Not Estab. %   Immature Grans (Abs) 0.0 0.0 - 0.1 x10E3/uL  Comprehensive metabolic panel   Collection Time: 02/03/24  4:37 PM  Result Value Ref Range   Glucose 86 70 - 99 mg/dL   BUN 14 6 - 24 mg/dL   Creatinine, Ser 9.14 0.57 - 1.00 mg/dL   eGFR 97 >78 GN/FAO/1.30   BUN/Creatinine Ratio 19 9 - 23   Sodium 139 134 - 144 mmol/L   Potassium 3.9 3.5 - 5.2 mmol/L   Chloride 98 96 - 106 mmol/L   CO2 25 20 - 29 mmol/L   Calcium 9.5 8.7 - 10.2 mg/dL   Total Protein 7.6 6.0 - 8.5 g/dL   Albumin 4.6 3.8 - 4.9 g/dL   Globulin, Total 3.0 1.5 - 4.5 g/dL   Bilirubin Total 0.4 0.0 - 1.2 mg/dL   Alkaline Phosphatase 104 44 - 121 IU/L   AST 18 0 - 40 IU/L   ALT 18 0 - 32 IU/L  Lipid Panel w/o Chol/HDL Ratio   Collection Time: 02/03/24  4:37 PM  Result Value Ref Range   Cholesterol, Total 220 (H) 100 - 199 mg/dL   Triglycerides 80 0 - 149 mg/dL   HDL 58 >86 mg/dL   VLDL Cholesterol Cal 14 5 - 40 mg/dL   LDL Chol Calc (NIH) 578 (H) 0 - 99 mg/dL  TSH   Collection Time: 02/03/24  4:37 PM  Result Value Ref Range   TSH 2.150 0.450 - 4.500 uIU/mL  Uric acid   Collection Time: 02/03/24  4:37 PM  Result Value Ref Range   Uric Acid 4.5 3.0 - 7.2 mg/dL  Ferritin   Collection Time: 02/03/24  4:37 PM  Result Value Ref Range    Ferritin 197 (H) 15 - 150 ng/mL      Assessment & Plan:   Problem List Items Addressed This Visit       Cardiovascular and Mediastinum   Hot flashes - Primary   Not doing well. Will start iron  for ?RLS and estroven over the counter. If not significantly better in the next 4 weeks or if worsening will get her started on gabapentin- information given to review and consider. Will let us  know if she wants to start it sooner. Call with any concerns.         Digestive   GERD (gastroesophageal reflux disease)   Significantly better with protonix  but having significant break through off it- encouraged her to continue protonix . Recheck in a few weeks.       Relevant Medications   Probiotic Product (ALIGN PO)   Other Visit Diagnoses       Palpitations       Significantly better. No significant issue since treating GERD. Zio normal.     Class 1 obesity due to excess calories without serious comorbidity with body mass index (BMI) of 32.0 to 32.9 in adult       Would like to see nutritionist. Referral placed today. Call with any concerns.   Relevant Orders   Amb ref to Medical Nutrition Therapy-[facility]        Follow up plan: Return in about 4 weeks (around 04/14/2024) for OK to use same day.

## 2024-03-19 DIAGNOSIS — R232 Flushing: Secondary | ICD-10-CM | POA: Insufficient documentation

## 2024-03-19 NOTE — Assessment & Plan Note (Signed)
 Not doing well. Will start iron  for ?RLS and estroven over the counter. If not significantly better in the next 4 weeks or if worsening will get her started on gabapentin- information given to review and consider. Will let us  know if she wants to start it sooner. Call with any concerns.

## 2024-03-19 NOTE — Assessment & Plan Note (Signed)
 Significantly better with protonix  but having significant break through off it- encouraged her to continue protonix . Recheck in a few weeks.

## 2024-03-23 ENCOUNTER — Encounter: Payer: Self-pay | Admitting: Family Medicine

## 2024-03-23 ENCOUNTER — Telehealth: Payer: Self-pay

## 2024-03-23 NOTE — Telephone Encounter (Unsigned)
 Copied from CRM [id number]. Topic: Clinical - Prescription Issue >> Mar 22, 2024 10:08 AM Felizardo Hotter wrote: Reason for CRM: Pt called stated pharmacy mentioned that pt should not take Iron , Ferrous Sulfate , 325 (65 Fe) MG TABS and Probiotic Product (ALIGN PO) together. Please advise pt via MyChart.

## 2024-03-23 NOTE — Telephone Encounter (Signed)
 Sending for provider recommendation

## 2024-03-23 NOTE — Telephone Encounter (Signed)
 Unsigned Note Copied from CRM [id number]. Topic: Clinical - Prescription Issue >> Mar 22, 2024 10:08 AM Felizardo Hotter wrote: Reason for CRM: Pt called stated pharmacy mentioned that pt should not take Iron , Ferrous Sulfate , 325 (65 Fe) MG TABS and Probiotic Product (ALIGN PO) together. Please advise pt via MyChart.

## 2024-04-09 ENCOUNTER — Other Ambulatory Visit: Payer: Self-pay | Admitting: Medical Genetics

## 2024-04-11 ENCOUNTER — Encounter (INDEPENDENT_AMBULATORY_CARE_PROVIDER_SITE_OTHER): Payer: Self-pay

## 2024-04-14 ENCOUNTER — Encounter: Payer: Self-pay | Admitting: Family Medicine

## 2024-04-14 ENCOUNTER — Ambulatory Visit: Admitting: Family Medicine

## 2024-04-14 VITALS — BP 127/85 | HR 78 | Temp 97.8°F | Wt 176.2 lb

## 2024-04-14 DIAGNOSIS — D509 Iron deficiency anemia, unspecified: Secondary | ICD-10-CM | POA: Diagnosis not present

## 2024-04-14 MED ORDER — IRON (FERROUS SULFATE) 325 (65 FE) MG PO TABS: 325.0000 mg | ORAL_TABLET | Freq: Every day | ORAL | 1 refills | Status: DC

## 2024-04-14 NOTE — Assessment & Plan Note (Signed)
 Under good control on current regimen. Continue current regimen. Continue to monitor. Call with any concerns. Refills given. Labs drawn today.

## 2024-04-14 NOTE — Progress Notes (Signed)
 BP 127/85   Pulse 78   Temp 97.8 F (36.6 C) (Oral)   Wt 176 lb 3.2 oz (79.9 kg)   LMP  (LMP Unknown)   SpO2 98%   BMI 32.23 kg/m    Subjective:    Patient ID: Stacy Phelps, female    DOB: 1971-04-18, 53 y.o.   MRN: [id number]  HPI: Stacy Phelps is a 53 y.o. female  Chief Complaint  Patient presents with   Hot Flashes    4 week f/up- patient states she has not been having a lot of hot flashes over the last month    Feeling so much better since starting the iron ! No more leg cramps. No more headaches. Hot flashes are significantly less. Tolerating the iron  well. No concerns.  Relevant past medical, surgical, family and social history reviewed and updated as indicated. Interim medical history since our last visit reviewed. Allergies and medications reviewed and updated.  Review of Systems  Constitutional: Negative.   Respiratory: Negative.    Cardiovascular: Negative.   Musculoskeletal: Negative.  Negative for arthralgias, back pain, gait problem, joint swelling, myalgias, neck pain and neck stiffness.  Skin: Negative.   Neurological: Negative.   Psychiatric/Behavioral: Negative.      Per HPI unless specifically indicated above     Objective:     BP 127/85   Pulse 78   Temp 97.8 F (36.6 C) (Oral)   Wt 176 lb 3.2 oz (79.9 kg)   LMP  (LMP Unknown)   SpO2 98%   BMI 32.23 kg/m   Wt Readings from Last 3 Encounters:  04/14/24 176 lb 3.2 oz (79.9 kg)  03/17/24 178 lb 3.2 oz (80.8 kg)  02/03/24 170 lb 3.2 oz (77.2 kg)    Physical Exam Vitals and nursing note reviewed.  Constitutional:      General: She is not in acute distress.    Appearance: Normal appearance. She is not ill-appearing, toxic-appearing or diaphoretic.  HENT:     Head: Normocephalic and atraumatic.     Right Ear: External ear normal.     Left Ear: External ear normal.     Nose: Nose normal.     Mouth/Throat:     Mouth: Mucous membranes are moist.     Pharynx: Oropharynx is clear.  Eyes:      General: No scleral icterus.       Right eye: No discharge.        Left eye: No discharge.     Extraocular Movements: Extraocular movements intact.     Conjunctiva/sclera: Conjunctivae normal.     Pupils: Pupils are equal, round, and reactive to light.  Cardiovascular:     Rate and Rhythm: Normal rate and regular rhythm.     Pulses: Normal pulses.     Heart sounds: Normal heart sounds. No murmur heard.    No friction rub. No gallop.  Pulmonary:     Effort: Pulmonary effort is normal. No respiratory distress.     Breath sounds: Normal breath sounds. No stridor. No wheezing, rhonchi or rales.  Chest:     Chest wall: No tenderness.  Musculoskeletal:        General: Normal range of motion.     Cervical back: Normal range of motion and neck supple.  Skin:    General: Skin is warm and dry.     Capillary Refill: Capillary refill takes less than 2 seconds.     Coloration: Skin is not jaundiced or pale.  Findings: No bruising, erythema, lesion or rash.  Neurological:     General: No focal deficit present.     Mental Status: She is alert and oriented to person, place, and time. Mental status is at baseline.  Psychiatric:        Mood and Affect: Mood normal.        Behavior: Behavior normal.        Thought Content: Thought content normal.        Judgment: Judgment normal.     Results for orders placed or performed in visit on 02/03/24  CBC with Differential/Platelet   Collection Time: 02/03/24  4:37 PM  Result Value Ref Range   WBC 5.4 3.4 - 10.8 x10E3/uL   RBC 4.47 3.77 - 5.28 x10E6/uL   Hemoglobin 13.2 11.1 - 15.9 g/dL   Hematocrit 40.9 81.1 - 46.6 %   MCV 89 79 - 97 fL   MCH 29.5 26.6 - 33.0 pg   MCHC 33.1 31.5 - 35.7 g/dL   RDW 91.4 78.2 - 95.6 %   Platelets 273 150 - 450 x10E3/uL   Neutrophils 61 Not Estab. %   Lymphs 28 Not Estab. %   Monocytes 8 Not Estab. %   Eos 2 Not Estab. %   Basos 1 Not Estab. %   Neutrophils Absolute 3.3 1.4 - 7.0 x10E3/uL    Lymphocytes Absolute 1.5 0.7 - 3.1 x10E3/uL   Monocytes Absolute 0.5 0.1 - 0.9 x10E3/uL   EOS (ABSOLUTE) 0.1 0.0 - 0.4 x10E3/uL   Basophils Absolute 0.0 0.0 - 0.2 x10E3/uL   Immature Granulocytes 0 Not Estab. %   Immature Grans (Abs) 0.0 0.0 - 0.1 x10E3/uL  Comprehensive metabolic panel   Collection Time: 02/03/24  4:37 PM  Result Value Ref Range   Glucose 86 70 - 99 mg/dL   BUN 14 6 - 24 mg/dL   Creatinine, Ser 2.13 0.57 - 1.00 mg/dL   eGFR 97 >08 MV/HQI/6.96   BUN/Creatinine Ratio 19 9 - 23   Sodium 139 134 - 144 mmol/L   Potassium 3.9 3.5 - 5.2 mmol/L   Chloride 98 96 - 106 mmol/L   CO2 25 20 - 29 mmol/L   Calcium 9.5 8.7 - 10.2 mg/dL   Total Protein 7.6 6.0 - 8.5 g/dL   Albumin 4.6 3.8 - 4.9 g/dL   Globulin, Total 3.0 1.5 - 4.5 g/dL   Bilirubin Total 0.4 0.0 - 1.2 mg/dL   Alkaline Phosphatase 104 44 - 121 IU/L   AST 18 0 - 40 IU/L   ALT 18 0 - 32 IU/L  Lipid Panel w/o Chol/HDL Ratio   Collection Time: 02/03/24  4:37 PM  Result Value Ref Range   Cholesterol, Total 220 (H) 100 - 199 mg/dL   Triglycerides 80 0 - 149 mg/dL   HDL 58 >29 mg/dL   VLDL Cholesterol Cal 14 5 - 40 mg/dL   LDL Chol Calc (NIH) 528 (H) 0 - 99 mg/dL  TSH   Collection Time: 02/03/24  4:37 PM  Result Value Ref Range   TSH 2.150 0.450 - 4.500 uIU/mL  Uric acid   Collection Time: 02/03/24  4:37 PM  Result Value Ref Range   Uric Acid 4.5 3.0 - 7.2 mg/dL  Ferritin   Collection Time: 02/03/24  4:37 PM  Result Value Ref Range   Ferritin 197 (H) 15 - 150 ng/mL      Assessment & Plan:   Problem List Items Addressed This Visit  Other   Iron  deficiency anemia - Primary   Under good control on current regimen. Continue current regimen. Continue to monitor. Call with any concerns. Refills given. Labs drawn today.        Relevant Medications   Iron , Ferrous Sulfate , 325 (65 Fe) MG TABS   Other Relevant Orders   CBC with Differential/Platelet   Ferritin   Iron  Binding Cap  (TIBC)([facility])     Follow up plan: Return september follow up.

## 2024-04-15 LAB — CBC WITH DIFFERENTIAL/PLATELET
Basophils Absolute: 0.1 10*3/uL (ref 0.0–0.2)
Basos: 1 %
EOS (ABSOLUTE): 0.1 10*3/uL (ref 0.0–0.4)
Eos: 2 %
Hematocrit: 37.2 % (ref 34.0–46.6)
Hemoglobin: 12.5 g/dL (ref 11.1–15.9)
Immature Grans (Abs): 0 10*3/uL (ref 0.0–0.1)
Immature Granulocytes: 0 %
Lymphocytes Absolute: 1.9 10*3/uL (ref 0.7–3.1)
Lymphs: 34 %
MCH: 30.6 pg (ref 26.6–33.0)
MCHC: 33.6 g/dL (ref 31.5–35.7)
MCV: 91 fL (ref 79–97)
Monocytes Absolute: 0.4 10*3/uL (ref 0.1–0.9)
Monocytes: 8 %
Neutrophils Absolute: 3.1 10*3/uL (ref 1.4–7.0)
Neutrophils: 55 %
Platelets: 248 10*3/uL (ref 150–450)
RBC: 4.09 x10E6/uL (ref 3.77–5.28)
RDW: 12 % (ref 11.7–15.4)
WBC: 5.5 10*3/uL (ref 3.4–10.8)

## 2024-04-15 LAB — IRON AND TIBC
Iron Saturation: 25 % (ref 15–55)
Iron: 84 ug/dL (ref 27–159)
Total Iron Binding Capacity: 341 ug/dL (ref 250–450)
UIBC: 257 ug/dL (ref 131–425)

## 2024-04-15 LAB — FERRITIN: Ferritin: 168 ng/mL — ABNORMAL HIGH (ref 15–150)

## 2024-04-18 ENCOUNTER — Ambulatory Visit: Payer: Self-pay | Admitting: Family Medicine

## 2024-05-15 ENCOUNTER — Encounter: Payer: Self-pay | Admitting: Dietician

## 2024-05-15 ENCOUNTER — Encounter: Attending: Family Medicine | Admitting: Dietician

## 2024-05-15 VITALS — Ht 62.0 in | Wt 180.8 lb

## 2024-05-15 DIAGNOSIS — E669 Obesity, unspecified: Secondary | ICD-10-CM | POA: Diagnosis present

## 2024-05-15 DIAGNOSIS — E785 Hyperlipidemia, unspecified: Secondary | ICD-10-CM | POA: Insufficient documentation

## 2024-05-15 DIAGNOSIS — Z713 Dietary counseling and surveillance: Secondary | ICD-10-CM | POA: Diagnosis not present

## 2024-05-15 DIAGNOSIS — Z6833 Body mass index (BMI) 33.0-33.9, adult: Secondary | ICD-10-CM | POA: Insufficient documentation

## 2024-05-15 DIAGNOSIS — D509 Iron deficiency anemia, unspecified: Secondary | ICD-10-CM | POA: Insufficient documentation

## 2024-05-15 DIAGNOSIS — K219 Gastro-esophageal reflux disease without esophagitis: Secondary | ICD-10-CM | POA: Insufficient documentation

## 2024-05-15 NOTE — Patient Instructions (Addendum)
 Reduce snacking by including balanced options for lunches Make healthy choices for snacks, including veggies and fruits, protein options like nuts, low fat cheese, or yogurt Begin some regular exercise using tread-pad.

## 2024-05-15 NOTE — Progress Notes (Signed)
 Medical Nutrition Therapy: Visit start time: 1315  end time: 1420  Assessment:   Referral Diagnosis: obesity Other medical history/ diagnoses: hyperlipidemia; iron  deficiency Psychosocial issues/ stress concerns: none  Medications, supplements: reconciled list in medical record   Current weight: 180.8lbs Height: 5'2 BMI: 33.07   Progress and evaluation:  Patient reports she did weight watchers in the past with short-term success; did optivia and lost significant weight 50lbs but then had hair loss and felt bad.  Tried again last year and lost about 25lbs then stopped when noticing side effects again. Currently weight is fluctuating within a few pounds.  She would like to improve cholesterol and prevent other health issues; strong family history for diabetes.  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 Food allergies: none known Recent lab results: 02/05/24 total cholesterol 220, HDL 58, LDL 148, triglycerides 80    Dietary Intake:  Usual eating pattern includes 3 meals and 1-2 snacks per day. Dining out frequency: 3-4 meals per week, usually [place] or [place] Who plans meals/ buys groceries? self Who prepares meals? Self, spouse  Breakfast: drinks juice to take iron , then waits 1 hour peanut butter toast or muffin Snack: none Lunch: 10:30pm when at school peanut butter sandwich + fruit ie clementines Snack: usually very hungry -- chips/ crackers Supper: grilled chicken/ burger/ tacos (buy side of beef yearly) Snack: none due to GERD Beverages: water, juice; stopped sodas, caffeine  Physical activity: has recently bought tread-pad, plans to begin soon.    Intervention:   Nutrition Care Education:   Basic nutrition: basic food groups; appropriate nutrient balance; appropriate meal and snack  schedule; general nutrition guidelines; promoting fulness and controlling hunger    Weight control: determining reasonable weight loss rate; importance of low sugar and low fat choices; portion control strategies including measuring portions, pre-portioning snacks; estimated energy needs for weight loss at 1200-1300 kcal, provided guidance for 45% CHO, 25% pro, and 30% fat; discussed physical activity and suitable options Advanced nutrition: cooking techniques-- bento box lunches for easy prep and ability to consume quickly or divide into mini meals; healthy snack options Hyperlipidemia:  healthy and unhealthy fats; role of fiber, plant sterols; role of exercise   Other intervention notes: Patient voices readiness to make positive lifestyle changes; has support from spouse. Established goals for change with direction from patient.    Nutritional Diagnosis:  [place]-2.2 Altered nutrition-related laboratory As related to hyperlipidemia.  As evidenced by elevated total cholesterol and LDL. [place]-3.3 Overweight/obesity As related to excess calories and inadequate physical activity; perimenopause.  As evidenced by patient with current BMI of 33.   Education Materials given:  Designer, industrial/product with food lists, sample meal pattern Snacking handout Visit summary with goals/ instructions   Learner/ who was taught:  Patient   Level of understanding: Verbalizes/ demonstrates competency  Demonstrated degree of understanding via:   Teach back Learning barriers: None  Willingness to learn/ readiness for change: Eager, change in progress  Monitoring and Evaluation:  Dietary intake, exercise, and body weight      follow up: 06/21/24

## 2024-06-21 ENCOUNTER — Ambulatory Visit: Admitting: Dietician

## 2024-06-30 ENCOUNTER — Encounter: Payer: Self-pay | Admitting: Family Medicine

## 2024-07-03 ENCOUNTER — Ambulatory Visit: Admitting: Dietician

## 2024-07-04 NOTE — Telephone Encounter (Unsigned)
 Copied from CRM [id number]. Topic: Clinical - Medication Question >> Jul 04, 2024  9:06 AM Charlet HERO wrote: Reason for CRM: Patient is calling about iron  pills she is wanting to have the dosage lowered or changed she is having cramping in legs and restless leg syndrome, She is also wanting to speak about weight loss that they have discussed privously.

## 2024-07-05 NOTE — Telephone Encounter (Signed)
 Patient is calling in to get an follow up on recent phone call for iron  pills, please contact her back for discussion.

## 2024-07-10 ENCOUNTER — Telehealth: Payer: Self-pay

## 2024-07-10 NOTE — Telephone Encounter (Signed)
 Copied from CRM #[id number]. Topic: General - Other >> Jul 07, 2024  1:20 PM Stacy Phelps wrote: Reason for CRM: Patient is calling in because she wants to know if the dosage on her iron  pills is going to change. Patient has sent multiple messages regarding this and has not heard anything back. Patient says she has an appointment scheduled for September, but wanted to know if she needed to be seen sooner because she is experiencing leg cramps again. Patient says when she had previously spoken with her provider, she mentioned possibly changing the dosage on her iron  pills and she says it's almost time for a refill and she wasn't sure if she should go ahead and refill the pills or if she should wait for the dosage change. Please follow up with patient.

## 2024-07-10 NOTE — Telephone Encounter (Signed)
 Copied from CRM #[id number]. Topic: Clinical - Medication Question >> Jul 10, 2024  4:12 PM Leah C wrote: Reason for CRM: Patient is frustrated because she called four times to try and get an understanding on whether she needs to increase the dosage on her iron  pills before she sends in a refill request. Patient deals with restless legs and is having trouble sleeping. Patient would appreciate some guidance.   [phone] (M)

## 2024-07-13 NOTE — Telephone Encounter (Signed)
 Can we please escalate this, I don't see any messages regarding her calling about iron  pills and this is the first message I've gotten about it

## 2024-07-17 ENCOUNTER — Encounter: Payer: Self-pay | Admitting: Family Medicine

## 2024-07-17 ENCOUNTER — Ambulatory Visit: Admitting: Family Medicine

## 2024-07-17 VITALS — BP 136/89 | HR 89 | Temp 98.5°F | Ht 62.0 in | Wt 186.2 lb

## 2024-07-17 DIAGNOSIS — Z789 Other specified health status: Secondary | ICD-10-CM

## 2024-07-17 DIAGNOSIS — E66811 Obesity, class 1: Secondary | ICD-10-CM | POA: Diagnosis not present

## 2024-07-17 DIAGNOSIS — D509 Iron deficiency anemia, unspecified: Secondary | ICD-10-CM

## 2024-07-17 DIAGNOSIS — E663 Overweight: Secondary | ICD-10-CM | POA: Insufficient documentation

## 2024-07-17 MED ORDER — TIRZEPATIDE-WEIGHT MANAGEMENT 2.5 MG/0.5ML ~~LOC~~ SOLN: 2.5000 mg | SUBCUTANEOUS | 1 refills | Status: DC

## 2024-07-17 NOTE — Progress Notes (Unsigned)
 BP 136/89   Pulse 89   Temp 98.5 F (36.9 C) (Oral)   Ht 5' 2 (1.575 m)   Wt 186 lb 3.2 oz (84.5 kg)   LMP  (LMP Unknown)   SpO2 96%   BMI 34.06 kg/m    Subjective:    Patient ID: Stacy Phelps, female    DOB: November 12, 1971, 53 y.o.   MRN: [id number]  HPI: Stacy Phelps is a 53 y.o. female  Chief Complaint  Patient presents with  . RESTLESS LEGS SYDROME     Checking iron  levels    OBESITY Duration: chronic Previous attempts at weight loss: optivia, diet and exercise Complications of obesity: Gout, GERD Peak weight: 186 (current) Weight loss goal: 130lbs Weight loss to date: none Requesting obesity pharmacotherapy: unsure Current weight loss supplements/medications: no Previous weight loss supplements/meds: no  ANEMIA Anemia status: unsure- RLS worse Etiology of anemia: iron  def Duration of anemia treatment: 3 months  Compliance with treatment: excellent compliance Iron  supplementation side effects: no Severity of anemia: moderate Fatigue: yes Decreased exercise tolerance: no  Dyspnea on exertion: no Palpitations: no Bleeding: no Pica: no  Relevant past medical, surgical, family and social history reviewed and updated as indicated. Interim medical history since our last visit reviewed. Allergies and medications reviewed and updated.  Review of Systems  Constitutional: Negative.   Respiratory: Negative.    Cardiovascular: Negative.   Gastrointestinal: Negative.   Neurological:  Positive for numbness and headaches. Negative for dizziness, tremors, seizures, syncope, facial asymmetry, speech difficulty, weakness and light-headedness.  Psychiatric/Behavioral: Negative.      Per HPI unless specifically indicated above     Objective:    BP 136/89   Pulse 89   Temp 98.5 F (36.9 C) (Oral)   Ht 5' 2 (1.575 m)   Wt 186 lb 3.2 oz (84.5 kg)   LMP  (LMP Unknown)   SpO2 96%   BMI 34.06 kg/m   Wt Readings from Last 3 Encounters:  07/17/24 186 lb 3.2 oz  (84.5 kg)  05/15/24 180 lb 12.8 oz (82 kg)  04/14/24 176 lb 3.2 oz (79.9 kg)    Physical Exam  Results for orders placed or performed in visit on 04/14/24  CBC with Differential/Platelet   Collection Time: 04/14/24  4:24 PM  Result Value Ref Range   WBC 5.5 3.4 - 10.8 x10E3/uL   RBC 4.09 3.77 - 5.28 x10E6/uL   Hemoglobin 12.5 11.1 - 15.9 g/dL   Hematocrit 62.7 65.9 - 46.6 %   MCV 91 79 - 97 fL   MCH 30.6 26.6 - 33.0 pg   MCHC 33.6 31.5 - 35.7 g/dL   RDW 87.9 88.2 - 84.5 %   Platelets 248 150 - 450 x10E3/uL   Neutrophils 55 Not Estab. %   Lymphs 34 Not Estab. %   Monocytes 8 Not Estab. %   Eos 2 Not Estab. %   Basos 1 Not Estab. %   Neutrophils Absolute 3.1 1.4 - 7.0 x10E3/uL   Lymphocytes Absolute 1.9 0.7 - 3.1 x10E3/uL   Monocytes Absolute 0.4 0.1 - 0.9 x10E3/uL   EOS (ABSOLUTE) 0.1 0.0 - 0.4 x10E3/uL   Basophils Absolute 0.1 0.0 - 0.2 x10E3/uL   Immature Granulocytes 0 Not Estab. %   Immature Grans (Abs) 0.0 0.0 - 0.1 x10E3/uL   Hematology Comments: Note:   Ferritin   Collection Time: 04/14/24  4:24 PM  Result Value Ref Range   Ferritin 168 (H) 15 - 150 ng/mL  Iron  Binding Cap (TIBC)([facility])   Collection Time: 04/14/24  4:24 PM  Result Value Ref Range   Total Iron  Binding Capacity 341 250 - 450 ug/dL   UIBC 742 868 - 574 ug/dL   Iron  84 27 - 159 ug/dL   Iron  Saturation 25 15 - 55 %      Assessment & Plan:   Problem List Items Addressed This Visit       Other   Iron  deficiency anemia - Primary   RLS worse. Will check labs and treat anemia as needed. If normal may need to start requip . Await results.      Relevant Orders   CBC with Differential/Platelet   Iron  Binding Cap (TIBC)([facility])   Ferritin   Comprehensive metabolic panel with GFR   Fecal occult blood, imunochemical([facility])   Obesity (BMI 30.0-34.9)   Other Visit Diagnoses       Hepatitis B vaccination status unknown       Labs drawn today. Await results.    Relevant Orders   Hepatitis B surface antibody,quantitative        Follow up plan: Return in about 6 weeks (around 08/28/2024).

## 2024-07-17 NOTE — Assessment & Plan Note (Signed)
 RLS worse. Will check labs and treat anemia as needed. If normal may need to start requip . Await results.

## 2024-07-18 ENCOUNTER — Encounter: Payer: Self-pay | Admitting: Family Medicine

## 2024-07-18 ENCOUNTER — Ambulatory Visit: Payer: Self-pay | Admitting: Family Medicine

## 2024-07-18 LAB — COMPREHENSIVE METABOLIC PANEL WITH GFR
ALT: 19 IU/L (ref 0–32)
AST: 19 IU/L (ref 0–40)
Albumin: 4.9 g/dL (ref 3.8–4.9)
Alkaline Phosphatase: 98 IU/L (ref 44–121)
BUN/Creatinine Ratio: 30 — ABNORMAL HIGH (ref 9–23)
BUN: 20 mg/dL (ref 6–24)
Bilirubin Total: 0.3 mg/dL (ref 0.0–1.2)
CO2: 23 mmol/L (ref 20–29)
Calcium: 9.9 mg/dL (ref 8.7–10.2)
Chloride: 100 mmol/L (ref 96–106)
Creatinine, Ser: 0.67 mg/dL (ref 0.57–1.00)
Globulin, Total: 2.6 g/dL (ref 1.5–4.5)
Glucose: 107 mg/dL — ABNORMAL HIGH (ref 70–99)
Potassium: 3.9 mmol/L (ref 3.5–5.2)
Sodium: 139 mmol/L (ref 134–144)
Total Protein: 7.5 g/dL (ref 6.0–8.5)
eGFR: 104 mL/min/1.73 (ref >59)

## 2024-07-18 LAB — IRON AND TIBC
Iron Saturation: 21 % (ref 15–55)
Iron: 76 ug/dL (ref 27–159)
Total Iron Binding Capacity: 365 ug/dL (ref 250–450)
UIBC: 289 ug/dL (ref 131–425)

## 2024-07-18 LAB — HEPATITIS B SURFACE ANTIBODY, QUANTITATIVE: Hepatitis B Surf Ab Quant: <3.5 m[IU]/mL — ABNORMAL LOW

## 2024-07-18 LAB — CBC WITH DIFFERENTIAL/PLATELET
Basophils Absolute: 0 x10E3/uL (ref 0.0–0.2)
Basos: 1 %
EOS (ABSOLUTE): 0.1 x10E3/uL (ref 0.0–0.4)
Eos: 2 %
Hematocrit: 38.7 % (ref 34.0–46.6)
Hemoglobin: 12.8 g/dL (ref 11.1–15.9)
Immature Grans (Abs): 0 x10E3/uL (ref 0.0–0.1)
Immature Granulocytes: 0 %
Lymphocytes Absolute: 1.5 x10E3/uL (ref 0.7–3.1)
Lymphs: 29 %
MCH: 30.5 pg (ref 26.6–33.0)
MCHC: 33.1 g/dL (ref 31.5–35.7)
MCV: 92 fL (ref 79–97)
Monocytes Absolute: 0.5 x10E3/uL (ref 0.1–0.9)
Monocytes: 9 %
Neutrophils Absolute: 3.1 x10E3/uL (ref 1.4–7.0)
Neutrophils: 59 %
Platelets: 257 x10E3/uL (ref 150–450)
RBC: 4.2 x10E6/uL (ref 3.77–5.28)
RDW: 12 % (ref 11.7–15.4)
WBC: 5.1 x10E3/uL (ref 3.4–10.8)

## 2024-07-18 LAB — FERRITIN: Ferritin: 198 ng/mL — ABNORMAL HIGH (ref 15–150)

## 2024-07-18 MED ORDER — ROPINIROLE HCL 0.25 MG PO TABS: 0.2500 mg | ORAL_TABLET | Freq: Every day | ORAL | 1 refills | Status: DC

## 2024-07-18 NOTE — Assessment & Plan Note (Signed)
 Would like to start zepbound . Rx sent to [facility]. Recheck tolerance in 6 weeks. Call with any concerns.

## 2024-07-19 NOTE — Telephone Encounter (Signed)
 Scheduled

## 2024-07-20 ENCOUNTER — Ambulatory Visit (INDEPENDENT_AMBULATORY_CARE_PROVIDER_SITE_OTHER)

## 2024-07-20 DIAGNOSIS — Z23 Encounter for immunization: Secondary | ICD-10-CM

## 2024-08-03 ENCOUNTER — Encounter: Payer: Self-pay | Admitting: Dietician

## 2024-08-03 NOTE — Progress Notes (Signed)
 Have not heard back from patient to reschedule her cancelled appointment from 07/03/24. Sent notification to referring provider.

## 2024-08-08 ENCOUNTER — Encounter: Admitting: Family Medicine

## 2024-08-15 ENCOUNTER — Ambulatory Visit: Admitting: Family Medicine

## 2024-08-29 ENCOUNTER — Encounter: Payer: Self-pay | Admitting: Family Medicine

## 2024-08-29 ENCOUNTER — Ambulatory Visit: Admitting: Family Medicine

## 2024-08-29 VITALS — BP 119/83 | HR 78 | Temp 98.0°F | Ht 62.0 in | Wt 164.1 lb

## 2024-08-29 DIAGNOSIS — E66811 Obesity, class 1: Secondary | ICD-10-CM | POA: Diagnosis not present

## 2024-08-29 DIAGNOSIS — Z23 Encounter for immunization: Secondary | ICD-10-CM

## 2024-08-29 MED ORDER — TIRZEPATIDE-WEIGHT MANAGEMENT 2.5 MG/0.5ML ~~LOC~~ SOLN: 2.5000 mg | SUBCUTANEOUS | 3 refills | Status: DC

## 2024-08-29 NOTE — Assessment & Plan Note (Signed)
 Down 22lbs for 12% of her body weight since starting zepbound . Tolerating well! Continue current regimen. Call with any concerns. Will let us  know if plateauing and we'll increase dose. Follow up in 3 months.

## 2024-08-29 NOTE — Progress Notes (Signed)
 BP 119/83   Pulse 78   Temp 98 F (36.7 C) (Oral)   Ht 5' 2 (1.575 m)   Wt 164 lb 2 oz (74.4 kg)   LMP  (LMP Unknown)   SpO2 98%   BMI 30.02 kg/m    Subjective:    Patient ID: Stacy Phelps, female    DOB: 22-May-1971, 53 y.o.   MRN: [id number]  HPI: Stacy Phelps is a 53 y.o. female  Chief Complaint  Patient presents with   Iron  Deficiency anemia   Follow up Zepbound    OBESITY- has had a little bit of constipation, but doing better with increased fluids and probiotics.  Duration: chronic Previous attempts at weight loss: optivia, diet and exercise Complications of obesity: Gout, GERD Peak weight: 186 (current) Weight loss goal: 130lbs Weight loss to date: 22lbs!! Requesting obesity pharmacotherapy: yes Current weight loss supplements/medications: yes Previous weight loss supplements/meds: no  Has not started the requip - no problems with her legs. Feeling well!    Relevant past medical, surgical, family and social history reviewed and updated as indicated. Interim medical history since our last visit reviewed. Allergies and medications reviewed and updated.  Review of Systems  Constitutional: Negative.   Respiratory: Negative.    Cardiovascular: Negative.   Musculoskeletal: Negative.   Skin: Negative.   Psychiatric/Behavioral: Negative.      Per HPI unless specifically indicated above     Objective:    BP 119/83   Pulse 78   Temp 98 F (36.7 C) (Oral)   Ht 5' 2 (1.575 m)   Wt 164 lb 2 oz (74.4 kg)   LMP  (LMP Unknown)   SpO2 98%   BMI 30.02 kg/m   Wt Readings from Last 3 Encounters:  08/29/24 164 lb 2 oz (74.4 kg)  07/17/24 186 lb 3.2 oz (84.5 kg)  05/15/24 180 lb 12.8 oz (82 kg)    Physical Exam Vitals and nursing note reviewed.  Constitutional:      General: She is not in acute distress.    Appearance: Normal appearance. She is not ill-appearing, toxic-appearing or diaphoretic.  HENT:     Head: Normocephalic and atraumatic.     Right Ear:  External ear normal.     Left Ear: External ear normal.     Nose: Nose normal.     Mouth/Throat:     Mouth: Mucous membranes are moist.     Pharynx: Oropharynx is clear.  Eyes:     General: No scleral icterus.       Right eye: No discharge.        Left eye: No discharge.     Extraocular Movements: Extraocular movements intact.     Conjunctiva/sclera: Conjunctivae normal.     Pupils: Pupils are equal, round, and reactive to light.  Cardiovascular:     Rate and Rhythm: Normal rate and regular rhythm.     Pulses: Normal pulses.     Heart sounds: Normal heart sounds. No murmur heard.    No friction rub. No gallop.  Pulmonary:     Effort: Pulmonary effort is normal. No respiratory distress.     Breath sounds: Normal breath sounds. No stridor. No wheezing, rhonchi or rales.  Chest:     Chest wall: No tenderness.  Musculoskeletal:        General: Normal range of motion.     Cervical back: Normal range of motion and neck supple.  Skin:    General: Skin is warm and dry.  Capillary Refill: Capillary refill takes less than 2 seconds.     Coloration: Skin is not jaundiced or pale.     Findings: No bruising, erythema, lesion or rash.  Neurological:     General: No focal deficit present.     Mental Status: She is alert and oriented to person, place, and time. Mental status is at baseline.  Psychiatric:        Mood and Affect: Mood normal.        Behavior: Behavior normal.        Thought Content: Thought content normal.        Judgment: Judgment normal.     Results for orders placed or performed in visit on 07/17/24  CBC with Differential/Platelet   Collection Time: 07/17/24  3:07 PM  Result Value Ref Range   WBC 5.1 3.4 - 10.8 x10E3/uL   RBC 4.20 3.77 - 5.28 x10E6/uL   Hemoglobin 12.8 11.1 - 15.9 g/dL   Hematocrit 61.2 65.9 - 46.6 %   MCV 92 79 - 97 fL   MCH 30.5 26.6 - 33.0 pg   MCHC 33.1 31.5 - 35.7 g/dL   RDW 87.9 88.2 - 84.5 %   Platelets 257 150 - 450 x10E3/uL    Neutrophils 59 Not Estab. %   Lymphs 29 Not Estab. %   Monocytes 9 Not Estab. %   Eos 2 Not Estab. %   Basos 1 Not Estab. %   Neutrophils Absolute 3.1 1.4 - 7.0 x10E3/uL   Lymphocytes Absolute 1.5 0.7 - 3.1 x10E3/uL   Monocytes Absolute 0.5 0.1 - 0.9 x10E3/uL   EOS (ABSOLUTE) 0.1 0.0 - 0.4 x10E3/uL   Basophils Absolute 0.0 0.0 - 0.2 x10E3/uL   Immature Granulocytes 0 Not Estab. %   Immature Grans (Abs) 0.0 0.0 - 0.1 x10E3/uL  Iron  Binding Cap (TIBC)([facility])   Collection Time: 07/17/24  3:07 PM  Result Value Ref Range   Total Iron  Binding Capacity 365 250 - 450 ug/dL   UIBC 710 868 - 574 ug/dL   Iron  76 27 - 159 ug/dL   Iron  Saturation 21 15 - 55 %  Ferritin   Collection Time: 07/17/24  3:07 PM  Result Value Ref Range   Ferritin 198 (H) 15 - 150 ng/mL  Comprehensive metabolic panel with GFR   Collection Time: 07/17/24  3:07 PM  Result Value Ref Range   Glucose 107 (H) 70 - 99 mg/dL   BUN 20 6 - 24 mg/dL   Creatinine, Ser 9.32 0.57 - 1.00 mg/dL   eGFR 895 >40 fO/fpw/8.26   BUN/Creatinine Ratio 30 (H) 9 - 23   Sodium 139 134 - 144 mmol/L   Potassium 3.9 3.5 - 5.2 mmol/L   Chloride 100 96 - 106 mmol/L   CO2 23 20 - 29 mmol/L   Calcium 9.9 8.7 - 10.2 mg/dL   Total Protein 7.5 6.0 - 8.5 g/dL   Albumin 4.9 3.8 - 4.9 g/dL   Globulin, Total 2.6 1.5 - 4.5 g/dL   Bilirubin Total 0.3 0.0 - 1.2 mg/dL   Alkaline Phosphatase 98 44 - 121 IU/L   AST 19 0 - 40 IU/L   ALT 19 0 - 32 IU/L  Hepatitis B surface antibody,quantitative   Collection Time: 07/17/24  3:07 PM  Result Value Ref Range   Hepatitis B Surf Ab Quant <3.5 (L) Immunity>10 mIU/mL      Assessment & Plan:   Problem List Items Addressed This Visit  Other   Obesity (BMI 30.0-34.9) - Primary   Down 22lbs for 12% of her body weight since starting zepbound . Tolerating well! Continue current regimen. Call with any concerns. Will let us  know if plateauing and we'll increase dose. Follow up in 3 months.        Other Visit Diagnoses       Needs flu shot       Flu shot given today.   Relevant Orders   Flu vaccine trivalent PF, 6mos and older(Flulaval,Afluria,Fluarix,Fluzone)     Immunization due       Hep B #2 given today.        Follow up plan: Return in about 3 months (around 11/29/2024).

## 2024-08-30 ENCOUNTER — Telehealth: Payer: Self-pay

## 2024-08-30 ENCOUNTER — Encounter: Payer: Self-pay | Admitting: Family Medicine

## 2024-08-30 NOTE — Telephone Encounter (Signed)
 Copied from CRM #[id number]. Topic: Clinical - Prescription Issue >> Aug 30, 2024  3:09 PM Wess RAMAN wrote: Reason for CRM: Patient would like tirzepatide  (ZEPBOUND ) 2.5 MG/0.5ML injection vial sent to a differnt pharmacy.  Callback #: [id number]  Pharmacy: [facility] [place], [place] - [id number] [phone] [place] [id number] Phone: [phone] Fax: [phone] Hours: Not open 24 hours

## 2024-08-31 ENCOUNTER — Other Ambulatory Visit: Payer: Self-pay | Admitting: Family Medicine

## 2024-08-31 MED ORDER — TIRZEPATIDE-WEIGHT MANAGEMENT 2.5 MG/0.5ML ~~LOC~~ SOLN: 2.5000 mg | SUBCUTANEOUS | 3 refills | Status: DC

## 2024-09-01 NOTE — Telephone Encounter (Signed)
 Requested Prescriptions  Refused Prescriptions Disp Refills   ZEPBOUND  2.5 MG/0.5ML injection vial [Pharmacy Med Name: ZEPBOUND  2.5 MG/0.5ML SUBCUTANEOUS SOLUTION] 2 mL 3    Sig: INJECT 0.5 ML (2.5 MG) UNDER THE SKIN ONCE WEEKLY (0.5ML= 50 UNITS)     Off-Protocol Failed - 09/01/2024  4:38 PM      Failed - Medication not assigned to a protocol, review manually.      Passed - Valid encounter within last 12 months    Recent Outpatient Visits           3 days ago Obesity (BMI 30.0-34.9)   [place] [facility], Megan P, DO   1 month ago Iron  deficiency anemia, unspecified iron  deficiency anemia type   [place] [facility] [place], Megan P, DO   4 months ago Iron  deficiency anemia, unspecified iron  deficiency anemia type   [place] [facility] [place], Megan P, DO   5 months ago Hot flashes   [place] [facility] [place], Megan P, DO   7 months ago Routine general medical examination at a health care facility   [facility], Megan P, [place]

## 2024-11-08 ENCOUNTER — Other Ambulatory Visit: Payer: Self-pay | Admitting: Family Medicine

## 2024-11-09 ENCOUNTER — Other Ambulatory Visit: Payer: Self-pay | Admitting: Family Medicine

## 2024-11-10 NOTE — Telephone Encounter (Signed)
 Requested Prescriptions  Pending Prescriptions Disp Refills   FEROSUL 325 (65 Fe) MG tablet [Pharmacy Med Name: FERROUS SULFATE  325MG  (5GR) TABS] 30 tablet     Sig: TAKE 1 TABLET BY MOUTH DAILY     Endocrinology:  Minerals - Iron  Supplementation Failed - 11/10/2024  1:57 PM      Failed - Ferritin in normal range and within 360 days    Ferritin  Date Value Ref Range Status  07/17/2024 198 (H) 15 - 150 ng/mL Final         Passed - HGB in normal range and within 360 days    Hemoglobin  Date Value Ref Range Status  07/17/2024 12.8 11.1 - 15.9 g/dL Final         Passed - HCT in normal range and within 360 days    Hematocrit  Date Value Ref Range Status  07/17/2024 38.7 34.0 - 46.6 % Final         Passed - RBC in normal range and within 360 days    RBC  Date Value Ref Range Status  07/17/2024 4.20 3.77 - 5.28 x10E6/uL Final         Passed - Fe (serum) in normal range and within 360 days    Iron   Date Value Ref Range Status  07/17/2024 76 27 - 159 ug/dL Final   Iron  Saturation  Date Value Ref Range Status  07/17/2024 21 15 - 55 % Final         Passed - Valid encounter within last 12 months    Recent Outpatient Visits           2 months ago Obesity (BMI 30.0-34.9)   [place] [facility] [place], [place] P, DO   3 months ago Iron  deficiency anemia, unspecified iron  deficiency anemia type   [place] [facility] [place], Megan P, DO   7 months ago Iron  deficiency anemia, unspecified iron  deficiency anemia type   [place] [facility] [place], Megan P, DO   7 months ago Hot flashes   [place] [facility] [place], Megan P, DO   9 months ago Routine general medical examination at a health care facility   [facility], Megan P, [place]

## 2024-11-13 NOTE — Telephone Encounter (Signed)
 Requested medication (s) are due for refill today: yes  Requested medication (s) are on the active medication list: yes  Last refill:  08/31/24  Future visit scheduled: yes  Notes to clinic:  Medication not assigned to a protocol, review manually.      Requested Prescriptions  Pending Prescriptions Disp Refills   ZEPBOUND  2.5 MG/0.5ML injection vial [Pharmacy Med Name: ZEPBOUND  2.5 MG/0.5ML SUBCUTANEOUS SOLUTION] 2 mL 3    Sig: INJECT 0.5 ML (2.5 MG) UNDER THE SKIN ONCE WEEKLY (0.5ML= 50 UNITS)     Off-Protocol Failed - 11/13/2024 12:48 PM      Failed - Medication not assigned to a protocol, review manually.      Passed - Valid encounter within last 12 months    Recent Outpatient Visits           2 months ago Obesity (BMI 30.0-34.9)   [place] [facility] [place], [place] P, DO   3 months ago Iron  deficiency anemia, unspecified iron  deficiency anemia type   [place] [facility] [place], Megan P, DO   7 months ago Iron  deficiency anemia, unspecified iron  deficiency anemia type   [place] [facility] [place], Megan P, DO   8 months ago Hot flashes   [place] [facility] [place], Megan P, DO   9 months ago Routine general medical examination at a health care facility   [facility], Megan P, [place]

## 2024-12-08 ENCOUNTER — Encounter: Payer: Self-pay | Admitting: Family Medicine

## 2024-12-08 ENCOUNTER — Ambulatory Visit: Admitting: Family Medicine

## 2024-12-08 VITALS — BP 128/70 | HR 68 | Temp 98.0°F | Ht 62.0 in | Wt 147.6 lb

## 2024-12-08 DIAGNOSIS — E663 Overweight: Secondary | ICD-10-CM | POA: Diagnosis not present

## 2024-12-08 MED ORDER — TIRZEPATIDE-WEIGHT MANAGEMENT 5 MG/0.5ML ~~LOC~~ SOLN: 5.0000 mg | SUBCUTANEOUS | 3 refills | Status: AC

## 2024-12-08 NOTE — Assessment & Plan Note (Signed)
 Congratulated patient on 39lb weight loss! Tolerating her zepbound  well with no concerns. Has plateaued a bit on her medicine. Will increase her to 5mg  and recheck in 3 months. Call with any concerns.

## 2024-12-08 NOTE — Progress Notes (Signed)
 "  BP 128/70   Pulse 68   Temp 98 F (36.7 C) (Oral)   Ht 5' 2 (1.575 m)   Wt 147 lb 9.6 oz (67 kg)   LMP  (LMP Unknown)   SpO2 98%   BMI 27.00 kg/m    Subjective:    Patient ID: Stacy Phelps, female    DOB: [birth year], 54 y.o.   MRN: [id number]  HPI: Stacy Phelps is a 54 y.o. female  Chief Complaint  Patient presents with   Obesity   OBESITY-  Previous attempts at weight loss: optivia, diet and exercise Complications of obesity: Gout, GERD Peak weight: 186 Weight loss goal: 130lbs Weight loss to date: 39lbs!! Requesting obesity pharmacotherapy: yes Current weight loss supplements/medications: yes Previous weight loss supplements/meds: no   Relevant past medical, surgical, family and social history reviewed and updated as indicated. Interim medical history since our last visit reviewed. Allergies and medications reviewed and updated.  Review of Systems  Constitutional: Negative.   Respiratory: Negative.    Cardiovascular: Negative.   Musculoskeletal: Negative.   Neurological: Negative.   Psychiatric/Behavioral: Negative.      Per HPI unless specifically indicated above     Objective:    BP 128/70   Pulse 68   Temp 98 F (36.7 C) (Oral)   Ht 5' 2 (1.575 m)   Wt 147 lb 9.6 oz (67 kg)   LMP  (LMP Unknown)   SpO2 98%   BMI 27.00 kg/m   Wt Readings from Last 3 Encounters:  12/08/24 147 lb 9.6 oz (67 kg)  08/29/24 164 lb 2 oz (74.4 kg)  07/17/24 186 lb 3.2 oz (84.5 kg)    Physical Exam Vitals and nursing note reviewed.  Constitutional:      General: She is not in acute distress.    Appearance: Normal appearance. She is not ill-appearing, toxic-appearing or diaphoretic.  HENT:     Head: Normocephalic and atraumatic.     Right Ear: External ear normal.     Left Ear: External ear normal.     Nose: Nose normal.     Mouth/Throat:     Mouth: Mucous membranes are moist.     Pharynx: Oropharynx is clear.  Eyes:     General: No scleral icterus.        Right eye: No discharge.        Left eye: No discharge.     Extraocular Movements: Extraocular movements intact.     Conjunctiva/sclera: Conjunctivae normal.     Pupils: Pupils are equal, round, and reactive to light.  Cardiovascular:     Rate and Rhythm: Normal rate and regular rhythm.     Pulses: Normal pulses.     Heart sounds: Normal heart sounds. No murmur heard.    No friction rub. No gallop.  Pulmonary:     Effort: Pulmonary effort is normal. No respiratory distress.     Breath sounds: Normal breath sounds. No stridor. No wheezing, rhonchi or rales.  Chest:     Chest wall: No tenderness.  Musculoskeletal:        General: Normal range of motion.     Cervical back: Normal range of motion and neck supple.  Skin:    General: Skin is warm and dry.     Capillary Refill: Capillary refill takes less than 2 seconds.     Coloration: Skin is not jaundiced or pale.     Findings: No bruising, erythema, lesion or rash.  Neurological:  General: No focal deficit present.     Mental Status: She is alert and oriented to person, place, and time. Mental status is at baseline.  Psychiatric:        Mood and Affect: Mood normal.        Behavior: Behavior normal.        Thought Content: Thought content normal.        Judgment: Judgment normal.     Results for orders placed or performed in visit on 07/17/24  CBC with Differential/Platelet   Collection Time: 07/17/24  3:07 PM  Result Value Ref Range   WBC 5.1 3.4 - 10.8 x10E3/uL   RBC 4.20 3.77 - 5.28 x10E6/uL   Hemoglobin 12.8 11.1 - 15.9 g/dL   Hematocrit 61.2 65.9 - 46.6 %   MCV 92 79 - 97 fL   MCH 30.5 26.6 - 33.0 pg   MCHC 33.1 31.5 - 35.7 g/dL   RDW 87.9 88.2 - 84.5 %   Platelets 257 150 - 450 x10E3/uL   Neutrophils 59 Not Estab. %   Lymphs 29 Not Estab. %   Monocytes 9 Not Estab. %   Eos 2 Not Estab. %   Basos 1 Not Estab. %   Neutrophils Absolute 3.1 1.4 - 7.0 x10E3/uL   Lymphocytes Absolute 1.5 0.7 - 3.1 x10E3/uL    Monocytes Absolute 0.5 0.1 - 0.9 x10E3/uL   EOS (ABSOLUTE) 0.1 0.0 - 0.4 x10E3/uL   Basophils Absolute 0.0 0.0 - 0.2 x10E3/uL   Immature Granulocytes 0 Not Estab. %   Immature Grans (Abs) 0.0 0.0 - 0.1 x10E3/uL  Iron  Binding Cap (TIBC)([facility])   Collection Time: 07/17/24  3:07 PM  Result Value Ref Range   Total Iron  Binding Capacity 365 250 - 450 ug/dL   UIBC 710 868 - 574 ug/dL   Iron  76 27 - 159 ug/dL   Iron  Saturation 21 15 - 55 %  Ferritin   Collection Time: 07/17/24  3:07 PM  Result Value Ref Range   Ferritin 198 (H) 15 - 150 ng/mL  Comprehensive metabolic panel with GFR   Collection Time: 07/17/24  3:07 PM  Result Value Ref Range   Glucose 107 (H) 70 - 99 mg/dL   BUN 20 6 - 24 mg/dL   Creatinine, Ser 9.32 0.57 - 1.00 mg/dL   eGFR 895 >40 fO/fpw/8.26   BUN/Creatinine Ratio 30 (H) 9 - 23   Sodium 139 134 - 144 mmol/L   Potassium 3.9 3.5 - 5.2 mmol/L   Chloride 100 96 - 106 mmol/L   CO2 23 20 - 29 mmol/L   Calcium 9.9 8.7 - 10.2 mg/dL   Total Protein 7.5 6.0 - 8.5 g/dL   Albumin 4.9 3.8 - 4.9 g/dL   Globulin, Total 2.6 1.5 - 4.5 g/dL   Bilirubin Total 0.3 0.0 - 1.2 mg/dL   Alkaline Phosphatase 98 44 - 121 IU/L   AST 19 0 - 40 IU/L   ALT 19 0 - 32 IU/L  Hepatitis B surface antibody,quantitative   Collection Time: 07/17/24  3:07 PM  Result Value Ref Range   Hepatitis B Surf Ab Quant <3.5 (L) Immunity>10 mIU/mL      Assessment & Plan:   Problem List Items Addressed This Visit       Other   Overweight (BMI 25.0-29.9) - Primary   Congratulated patient on 39lb weight loss! Tolerating her zepbound  well with no concerns. Has plateaued a bit on her medicine. Will increase her to 5mg  and recheck  in 3 months. Call with any concerns.        Follow up plan: Return in about 3 months (around 03/08/2025) for physical.      "

## 2025-03-08 ENCOUNTER — Encounter: Admitting: Family Medicine
